# Patient Record
Sex: Male | Born: 1980 | Race: Black or African American | Hispanic: No | Marital: Single | Smoking: Current some day smoker
Health system: Southern US, Community
[De-identification: ages and names within clinical notes are randomized; demographics above are authoritative.]

## PROBLEM LIST (undated history)

## (undated) DIAGNOSIS — M79606 Pain in leg, unspecified: Secondary | ICD-10-CM

## (undated) DIAGNOSIS — R030 Elevated blood-pressure reading, without diagnosis of hypertension: Secondary | ICD-10-CM

## (undated) DIAGNOSIS — R739 Hyperglycemia, unspecified: Secondary | ICD-10-CM

## (undated) DIAGNOSIS — R002 Palpitations: Secondary | ICD-10-CM

## (undated) HISTORY — PX: FEMUR FRACTURE SURGERY: SHX633

## (undated) HISTORY — PX: LEG SURGERY: SHX1003

## (undated) HISTORY — DX: Palpitations: R00.2

## (undated) HISTORY — DX: Hyperglycemia, unspecified: R73.9

## (undated) HISTORY — DX: Elevated blood-pressure reading, without diagnosis of hypertension: R03.0

## (undated) HISTORY — DX: Pain in leg, unspecified: M79.606

---

## 2003-09-03 ENCOUNTER — Emergency Department (HOSPITAL_COMMUNITY): Admission: EM | Admit: 2003-09-03 | Discharge: 2003-09-04 | Payer: Self-pay | Admitting: Emergency Medicine

## 2003-09-04 ENCOUNTER — Encounter: Payer: Self-pay | Admitting: Emergency Medicine

## 2004-01-25 ENCOUNTER — Emergency Department (HOSPITAL_COMMUNITY): Admission: EM | Admit: 2004-01-25 | Discharge: 2004-01-25 | Payer: Self-pay | Admitting: Emergency Medicine

## 2004-08-05 ENCOUNTER — Emergency Department (HOSPITAL_COMMUNITY): Admission: EM | Admit: 2004-08-05 | Discharge: 2004-08-05 | Payer: Self-pay | Admitting: Emergency Medicine

## 2004-12-19 ENCOUNTER — Emergency Department (HOSPITAL_COMMUNITY): Admission: EM | Admit: 2004-12-19 | Discharge: 2004-12-19 | Payer: Self-pay | Admitting: Emergency Medicine

## 2005-01-20 ENCOUNTER — Emergency Department (HOSPITAL_COMMUNITY): Admission: EM | Admit: 2005-01-20 | Discharge: 2005-01-20 | Payer: Self-pay | Admitting: Family Medicine

## 2007-01-20 ENCOUNTER — Emergency Department (HOSPITAL_COMMUNITY): Admission: EM | Admit: 2007-01-20 | Discharge: 2007-01-21 | Payer: Self-pay | Admitting: Emergency Medicine

## 2008-05-06 ENCOUNTER — Emergency Department (HOSPITAL_COMMUNITY): Admission: EM | Admit: 2008-05-06 | Discharge: 2008-05-06 | Payer: Self-pay | Admitting: Emergency Medicine

## 2008-08-22 ENCOUNTER — Emergency Department (HOSPITAL_COMMUNITY): Admission: EM | Admit: 2008-08-22 | Discharge: 2008-08-22 | Payer: Self-pay | Admitting: Family Medicine

## 2008-10-26 ENCOUNTER — Inpatient Hospital Stay (HOSPITAL_COMMUNITY): Admission: EM | Admit: 2008-10-26 | Discharge: 2008-10-29 | Payer: Self-pay | Admitting: Emergency Medicine

## 2008-11-16 ENCOUNTER — Encounter: Admission: RE | Admit: 2008-11-16 | Discharge: 2008-12-22 | Payer: Self-pay | Admitting: Orthopedic Surgery

## 2008-12-29 ENCOUNTER — Encounter: Admission: RE | Admit: 2008-12-29 | Discharge: 2009-03-29 | Payer: Self-pay | Admitting: Orthopedic Surgery

## 2009-04-14 ENCOUNTER — Ambulatory Visit (HOSPITAL_COMMUNITY): Admission: RE | Admit: 2009-04-14 | Discharge: 2009-04-14 | Payer: Self-pay | Admitting: Orthopedic Surgery

## 2009-09-08 ENCOUNTER — Ambulatory Visit (HOSPITAL_COMMUNITY): Admission: RE | Admit: 2009-09-08 | Discharge: 2009-09-08 | Payer: Self-pay | Admitting: Orthopedic Surgery

## 2010-03-27 ENCOUNTER — Emergency Department (HOSPITAL_COMMUNITY): Admission: EM | Admit: 2010-03-27 | Discharge: 2010-03-27 | Payer: Self-pay | Admitting: Family Medicine

## 2010-09-17 ENCOUNTER — Emergency Department (HOSPITAL_COMMUNITY): Admission: EM | Admit: 2010-09-17 | Discharge: 2010-09-17 | Payer: Self-pay | Admitting: Family Medicine

## 2010-11-20 ENCOUNTER — Emergency Department (HOSPITAL_COMMUNITY): Admission: EM | Admit: 2010-11-20 | Discharge: 2010-11-21 | Payer: Self-pay | Admitting: Emergency Medicine

## 2011-01-14 ENCOUNTER — Emergency Department (HOSPITAL_COMMUNITY)
Admission: EM | Admit: 2011-01-14 | Discharge: 2011-01-14 | Payer: Self-pay | Source: Home / Self Care | Admitting: Emergency Medicine

## 2011-01-15 LAB — RAPID STREP SCREEN (MED CTR MEBANE ONLY): Streptococcus, Group A Screen (Direct): NEGATIVE

## 2011-03-29 LAB — CBC
HCT: 46 % (ref 39.0–52.0)
MCHC: 33.8 g/dL (ref 30.0–36.0)
MCV: 90.3 fL (ref 78.0–100.0)
RBC: 5.09 MIL/uL (ref 4.22–5.81)
WBC: 4.9 10*3/uL (ref 4.0–10.5)

## 2011-03-29 LAB — DIFFERENTIAL
Basophils Relative: 1 % (ref 0–1)
Eosinophils Absolute: 0.1 10*3/uL (ref 0.0–0.7)
Eosinophils Relative: 3 % (ref 0–5)
Lymphs Abs: 1.8 10*3/uL (ref 0.7–4.0)
Monocytes Absolute: 0.3 10*3/uL (ref 0.1–1.0)
Monocytes Relative: 6 % (ref 3–12)
Neutrophils Relative %: 53 % (ref 43–77)

## 2011-03-29 LAB — BASIC METABOLIC PANEL
CO2: 28 mEq/L (ref 19–32)
Chloride: 102 mEq/L (ref 96–112)
Creatinine, Ser: 1.35 mg/dL (ref 0.4–1.5)
GFR calc Af Amer: >60 mL/min (ref >60)
Potassium: 4.3 mEq/L (ref 3.5–5.1)

## 2011-03-29 LAB — URINALYSIS, ROUTINE W REFLEX MICROSCOPIC
Bilirubin Urine: NEGATIVE
Glucose, UA: NEGATIVE mg/dL
Hgb urine dipstick: NEGATIVE
Specific Gravity, Urine: 1.027 (ref 1.005–1.030)
Urobilinogen, UA: 1 mg/dL (ref 0.0–1.0)
pH: 6.5 (ref 5.0–8.0)

## 2011-03-29 LAB — PROTIME-INR: INR: 0.9 (ref 0.00–1.49)

## 2011-04-03 LAB — BASIC METABOLIC PANEL
CO2: 28 mEq/L (ref 19–32)
Calcium: 9.6 mg/dL (ref 8.4–10.5)
Chloride: 101 mEq/L (ref 96–112)
GFR calc Af Amer: >60 mL/min (ref >60)
Glucose, Bld: 106 mg/dL — ABNORMAL HIGH (ref 70–99)
Potassium: 4.3 mEq/L (ref 3.5–5.1)
Sodium: 136 mEq/L (ref 135–145)

## 2011-04-03 LAB — URINALYSIS, ROUTINE W REFLEX MICROSCOPIC
Bilirubin Urine: NEGATIVE
Hgb urine dipstick: NEGATIVE
Specific Gravity, Urine: 1.01 (ref 1.005–1.030)
Urobilinogen, UA: 1 mg/dL (ref 0.0–1.0)
pH: 7 (ref 5.0–8.0)

## 2011-04-03 LAB — DIFFERENTIAL
Basophils Relative: 1 % (ref 0–1)
Eosinophils Absolute: 0.1 10*3/uL (ref 0.0–0.7)
Monocytes Relative: 7 % (ref 3–12)
Neutrophils Relative %: 55 % (ref 43–77)

## 2011-04-03 LAB — CBC
MCHC: 33.9 g/dL (ref 30.0–36.0)
MCV: 88.2 fL (ref 78.0–100.0)
RBC: 5.19 MIL/uL (ref 4.22–5.81)
RDW: 16.1 % — ABNORMAL HIGH (ref 11.5–15.5)

## 2011-04-03 LAB — PROTIME-INR: INR: 0.9 (ref 0.00–1.49)

## 2011-04-30 ENCOUNTER — Emergency Department (HOSPITAL_COMMUNITY)
Admission: EM | Admit: 2011-04-30 | Discharge: 2011-04-30 | Disposition: A | Discharge status: Home / Self Care | Payer: Self-pay | Attending: Emergency Medicine | Admitting: Emergency Medicine

## 2011-04-30 DIAGNOSIS — J020 Streptococcal pharyngitis: Secondary | ICD-10-CM | POA: Insufficient documentation

## 2011-04-30 DIAGNOSIS — R6883 Chills (without fever): Secondary | ICD-10-CM | POA: Insufficient documentation

## 2011-04-30 DIAGNOSIS — R070 Pain in throat: Secondary | ICD-10-CM | POA: Insufficient documentation

## 2011-04-30 DIAGNOSIS — R599 Enlarged lymph nodes, unspecified: Secondary | ICD-10-CM | POA: Insufficient documentation
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
## 2011-09-24 LAB — URINALYSIS, ROUTINE W REFLEX MICROSCOPIC
Bilirubin Urine: NEGATIVE
Hgb urine dipstick: NEGATIVE
Specific Gravity, Urine: 1.022
pH: 6

## 2011-09-24 LAB — COMPREHENSIVE METABOLIC PANEL
ALT: 21
AST: 44 — ABNORMAL HIGH
Alkaline Phosphatase: 56
CO2: 27
Glucose, Bld: 98
Potassium: 3.7
Sodium: 133 — ABNORMAL LOW
Total Protein: 6.4

## 2011-09-24 LAB — CBC
HCT: 45.9
MCV: 90.6
RBC: 5.07
WBC: 9.4

## 2011-09-24 LAB — BASIC METABOLIC PANEL
BUN: 4 — ABNORMAL LOW
CO2: 32
Chloride: 97
GFR calc non Af Amer: >60
Glucose, Bld: 105 — ABNORMAL HIGH
Potassium: 3.8

## 2011-09-24 LAB — RAPID URINE DRUG SCREEN, HOSP PERFORMED
Amphetamines: NOT DETECTED
Barbiturates: NOT DETECTED

## 2011-09-24 LAB — HEMOGLOBIN AND HEMATOCRIT, BLOOD: Hemoglobin: 12.7 — ABNORMAL LOW

## 2011-09-24 LAB — URINE MICROSCOPIC-ADD ON

## 2011-09-24 LAB — POCT I-STAT, CHEM 8
Chloride: 102
Creatinine, Ser: 2 — ABNORMAL HIGH
Glucose, Bld: 112 — ABNORMAL HIGH
Potassium: 3.6

## 2012-03-10 ENCOUNTER — Encounter (HOSPITAL_COMMUNITY): Payer: Self-pay | Admitting: *Deleted

## 2012-03-10 ENCOUNTER — Emergency Department (INDEPENDENT_AMBULATORY_CARE_PROVIDER_SITE_OTHER)
Admission: EM | Admit: 2012-03-10 | Discharge: 2012-03-10 | Disposition: A | Discharge status: Home / Self Care | Payer: Self-pay | Source: Home / Self Care | Attending: Family Medicine | Admitting: Family Medicine

## 2012-03-10 DIAGNOSIS — M25569 Pain in unspecified knee: Secondary | ICD-10-CM

## 2012-03-10 DIAGNOSIS — M25562 Pain in left knee: Secondary | ICD-10-CM

## 2012-03-10 MED ORDER — DICLOFENAC POTASSIUM 50 MG PO TABS: 50.0000 mg | ORAL_TABLET | Freq: Three times a day (TID) | ORAL | Status: AC

## 2012-03-10 NOTE — Discharge Instructions (Signed)
Wear knee brace for comfort and see your orthopedist if further problems.
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
## 2016-07-15 ENCOUNTER — Emergency Department (HOSPITAL_COMMUNITY): Payer: Self-pay

## 2016-07-15 ENCOUNTER — Emergency Department (HOSPITAL_COMMUNITY)
Admission: EM | Admit: 2016-07-15 | Discharge: 2016-07-15 | Disposition: A | Discharge status: Home / Self Care | Payer: Self-pay | Attending: Emergency Medicine | Admitting: Emergency Medicine

## 2016-07-15 ENCOUNTER — Encounter (HOSPITAL_COMMUNITY): Payer: Self-pay | Admitting: *Deleted

## 2016-07-15 DIAGNOSIS — G8929 Other chronic pain: Secondary | ICD-10-CM

## 2016-07-15 DIAGNOSIS — M79605 Pain in left leg: Secondary | ICD-10-CM | POA: Insufficient documentation

## 2016-07-15 DIAGNOSIS — M25462 Effusion, left knee: Secondary | ICD-10-CM | POA: Insufficient documentation

## 2016-07-15 DIAGNOSIS — M25552 Pain in left hip: Secondary | ICD-10-CM | POA: Insufficient documentation

## 2016-07-15 DIAGNOSIS — M25562 Pain in left knee: Secondary | ICD-10-CM | POA: Insufficient documentation

## 2016-07-15 DIAGNOSIS — F17210 Nicotine dependence, cigarettes, uncomplicated: Secondary | ICD-10-CM | POA: Insufficient documentation

## 2016-07-15 DIAGNOSIS — M79602 Pain in left arm: Secondary | ICD-10-CM

## 2016-07-15 MED ORDER — NAPROXEN 500 MG PO TABS: 500.0000 mg | ORAL_TABLET | Freq: Two times a day (BID) | ORAL | 0 refills | Status: DC

## 2016-07-15 NOTE — Discharge Instructions (Signed)
Medications: Naprosyn  Treatment: Take Naprosyn twice daily for your pain and inflammation. Use ice 3-4 times daily alternating 20 minutes on, 20 minutes off. Wear your knee brace for support at all times except when bathing.  Follow-up: Please follow-up with Dr. Nilsa Nutting office, an orthopedic doctor, for further evaluation and treatment of your leg pain. Please return to the emergency department if you develop any new or worsening symptoms.
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
## 2016-12-12 ENCOUNTER — Ambulatory Visit (HOSPITAL_COMMUNITY)
Admission: EM | Admit: 2016-12-12 | Discharge: 2016-12-12 | Disposition: A | Discharge status: Home / Self Care | Payer: BLUE CROSS/BLUE SHIELD | Attending: Emergency Medicine | Admitting: Emergency Medicine

## 2016-12-12 ENCOUNTER — Encounter (HOSPITAL_COMMUNITY): Payer: Self-pay | Admitting: Emergency Medicine

## 2016-12-12 DIAGNOSIS — M5442 Lumbago with sciatica, left side: Secondary | ICD-10-CM | POA: Diagnosis not present

## 2016-12-12 DIAGNOSIS — G8929 Other chronic pain: Secondary | ICD-10-CM

## 2016-12-12 DIAGNOSIS — M62830 Muscle spasm of back: Secondary | ICD-10-CM

## 2016-12-12 MED ORDER — KETOROLAC TROMETHAMINE 60 MG/2ML IM SOLN
60.0000 mg | Freq: Once | INTRAMUSCULAR | Status: AC
  Administered 2016-12-12: 60 mg via INTRAMUSCULAR

## 2016-12-12 MED ORDER — DICLOFENAC SODIUM 50 MG PO TBEC: 50.0000 mg | DELAYED_RELEASE_TABLET | Freq: Three times a day (TID) | ORAL | 0 refills | Status: DC

## 2016-12-12 MED ORDER — CYCLOBENZAPRINE HCL 10 MG PO TABS: 10.0000 mg | ORAL_TABLET | Freq: Three times a day (TID) | ORAL | 0 refills | Status: DC | PRN

## 2016-12-12 MED ORDER — KETOROLAC TROMETHAMINE 60 MG/2ML IM SOLN
INTRAMUSCULAR | Status: AC
  Filled 2016-12-12: qty 2
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
## 2016-12-12 NOTE — ED Provider Notes (Signed)
CSN: [id number]     Arrival date & time 12/12/16  1216 History   First MD Initiated Contact with Patient 12/12/16 1250     Chief Complaint  Patient presents with  . Back Pain   (Consider location/radiation/quality/duration/timing/severity/associated sxs/prior Treatment) HPI: 35 yo male with H/O lower back pain with hardware in left leg, presented with CC of lower back pain radiating to left leg x3 days. Pain gradually worsening.Pain worse with active/passive movement, prolong standing. Denies recent injury/trauma/fall  History reviewed. No pertinent past medical history. Past Surgical History:  Procedure Laterality Date  . FEMUR FRACTURE SURGERY    . LEG SURGERY     Family History  Problem Relation Age of Onset  . Hypertension Mother   . Asthma Other    Social History  Substance Use Topics  . Smoking status: Current Every Day Smoker    Packs/day: 0.50    Years: 10.00    Types: Cigarettes  . Smokeless tobacco: Never Used  . Alcohol use No    Review of Systems  Musculoskeletal: Positive for back pain.  Back pain , radiating to left leg.  Allergies  Patient has no known allergies.  Home Medications   Prior to Admission medications   Medication Sig Start Date End Date Taking? Authorizing Provider  cyclobenzaprine (FLEXERIL) 10 MG tablet Take 1 tablet (10 mg total) by mouth 3 (three) times daily as needed for muscle spasms. 12/12/16   Lorrin Nawrot, NP  diclofenac (VOLTAREN) 50 MG EC tablet Take 1 tablet (50 mg total) by mouth 3 (three) times daily. 12/12/16   Matilda Fleig, NP  naproxen (NAPROSYN) 500 MG tablet Take 1 tablet (500 mg total) by mouth 2 (two) times daily. 07/15/16   Emi HolesAlexandra M Law, PA-C   Meds Ordered and Administered this Visit   Medications  ketorolac (TORADOL) injection 60 mg (not administered)    BP 117/72 (BP Location: Left Arm)   Pulse 86   Temp 99.4 F (37.4 C) (Oral)   Resp 16   SpO2 96%  No data found.   Physical Exam   Musculoskeletal: He exhibits tenderness. He exhibits no edema or deformity.  Pain to lower back, tenderness to palpation on L/S vertebrae and papa spinuous processes. Loss of lumbar lordosis due to muscle spasm. Unable to perform straight leg raise test due to pain. No visible swelling/bruising appreciated  Urgent Care Course   Clinical Course     Procedures (including critical care time)  Labs Review Labs Reviewed - No data to display  Imaging Review No results found.   Visual Acuity Review  Right Eye Distance:   Left Eye Distance:   Bilateral Distance:    Right Eye Near:   Left Eye Near:    Bilateral Near:         MDM   1. Chronic bilateral low back pain with left-sided sciatica   2. Muscle spasm of back    Bending stretching exercises as tolerated  Heating pad to lower back    Llewellyn Choplin, NP 12/12/16 1311

## 2016-12-12 NOTE — Discharge Instructions (Signed)
Light duty (Avoid bending/stretching/pulling/pushing) x 2 days

## 2017-04-15 ENCOUNTER — Emergency Department (HOSPITAL_COMMUNITY): Payer: BLUE CROSS/BLUE SHIELD

## 2017-04-15 ENCOUNTER — Emergency Department (HOSPITAL_COMMUNITY)
Admission: EM | Admit: 2017-04-15 | Discharge: 2017-04-15 | Disposition: A | Discharge status: Home / Self Care | Payer: BLUE CROSS/BLUE SHIELD | Attending: Emergency Medicine | Admitting: Emergency Medicine

## 2017-04-15 DIAGNOSIS — R079 Chest pain, unspecified: Secondary | ICD-10-CM | POA: Insufficient documentation

## 2017-04-15 DIAGNOSIS — G8929 Other chronic pain: Secondary | ICD-10-CM | POA: Insufficient documentation

## 2017-04-15 DIAGNOSIS — R0602 Shortness of breath: Secondary | ICD-10-CM | POA: Insufficient documentation

## 2017-04-15 DIAGNOSIS — F17210 Nicotine dependence, cigarettes, uncomplicated: Secondary | ICD-10-CM | POA: Insufficient documentation

## 2017-04-15 DIAGNOSIS — M79605 Pain in left leg: Secondary | ICD-10-CM | POA: Diagnosis not present

## 2017-04-15 DIAGNOSIS — Z79899 Other long term (current) drug therapy: Secondary | ICD-10-CM | POA: Diagnosis not present

## 2017-04-15 LAB — CBC WITH DIFFERENTIAL/PLATELET
BASOS ABS: 0.1 10*3/uL (ref 0.0–0.1)
Basophils Relative: 1 %
EOS ABS: 0.4 10*3/uL (ref 0.0–0.7)
EOS PCT: 6 %
HCT: 43.3 % (ref 39.0–52.0)
Hemoglobin: 14.7 g/dL (ref 13.0–17.0)
LYMPHS PCT: 39 %
Lymphs Abs: 2.7 10*3/uL (ref 0.7–4.0)
MCH: 29.3 pg (ref 26.0–34.0)
MCHC: 33.9 g/dL (ref 30.0–36.0)
MCV: 86.3 fL (ref 78.0–100.0)
MONO ABS: 0.7 10*3/uL (ref 0.1–1.0)
Monocytes Relative: 10 %
Neutro Abs: 3 10*3/uL (ref 1.7–7.7)
Neutrophils Relative %: 44 %
PLATELETS: 387 10*3/uL (ref 150–400)
RBC: 5.02 MIL/uL (ref 4.22–5.81)
RDW: 14.6 % (ref 11.5–15.5)
WBC: 6.9 10*3/uL (ref 4.0–10.5)

## 2017-04-15 LAB — COMPREHENSIVE METABOLIC PANEL
ALT: 18 U/L (ref 17–63)
AST: 23 U/L (ref 15–41)
Albumin: 3.8 g/dL (ref 3.5–5.0)
Alkaline Phosphatase: 92 U/L (ref 38–126)
Anion gap: 8 (ref 5–15)
BUN: 14 mg/dL (ref 6–20)
CALCIUM: 9 mg/dL (ref 8.9–10.3)
CHLORIDE: 102 mmol/L (ref 101–111)
CO2: 27 mmol/L (ref 22–32)
CREATININE: 1.12 mg/dL (ref 0.61–1.24)
Glucose, Bld: 128 mg/dL — ABNORMAL HIGH (ref 65–99)
Potassium: 4 mmol/L (ref 3.5–5.1)
SODIUM: 137 mmol/L (ref 135–145)
Total Bilirubin: 0.5 mg/dL (ref 0.3–1.2)
Total Protein: 8 g/dL (ref 6.5–8.1)

## 2017-04-15 LAB — I-STAT TROPONIN, ED: TROPONIN I, POC: 0 ng/mL (ref 0.00–0.08)

## 2017-04-15 LAB — D-DIMER, QUANTITATIVE: D-Dimer, Quant: 0.31 ug/mL-FEU (ref 0.00–0.50)

## 2017-04-15 MED ORDER — TRAMADOL HCL 50 MG PO TABS: 50.0000 mg | ORAL_TABLET | Freq: Two times a day (BID) | ORAL | 0 refills | Status: DC | PRN

## 2017-04-15 MED ORDER — KETOROLAC TROMETHAMINE 60 MG/2ML IM SOLN
60.0000 mg | Freq: Once | INTRAMUSCULAR | Status: AC
  Administered 2017-04-15: 60 mg via INTRAMUSCULAR
  Filled 2017-04-15: qty 2

## 2017-04-15 MED ORDER — TRAMADOL HCL 50 MG PO TABS
50.0000 mg | ORAL_TABLET | Freq: Once | ORAL | Status: AC
  Administered 2017-04-15: 50 mg via ORAL
  Filled 2017-04-15: qty 1

## 2017-04-15 NOTE — ED Notes (Signed)
Bed: WA04 Expected date:  Expected time:  Means of arrival:  Comments: 
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
## 2017-04-15 NOTE — ED Triage Notes (Signed)
Pt c/o left leg pain for 2-3 days. Pt can barely put weight on it. Pain is on side of leg between hip and knee. Pt also stating he feels like his heart is stopping and has chills. Denies chest pain, states he does have some shortness of breath.

## 2017-04-15 NOTE — ED Notes (Signed)
Bed: WA03 Expected date:  Expected time:  Means of arrival:  Comments: 

## 2017-04-24 ENCOUNTER — Ambulatory Visit: Payer: BLUE CROSS/BLUE SHIELD | Attending: Internal Medicine | Admitting: Physician Assistant

## 2017-04-24 VITALS — BP 147/93 | HR 79 | Temp 98.2°F | Resp 16 | Wt 256.8 lb

## 2017-04-24 DIAGNOSIS — R030 Elevated blood-pressure reading, without diagnosis of hypertension: Secondary | ICD-10-CM | POA: Diagnosis not present

## 2017-04-24 DIAGNOSIS — R739 Hyperglycemia, unspecified: Secondary | ICD-10-CM | POA: Diagnosis not present

## 2017-04-24 DIAGNOSIS — R002 Palpitations: Secondary | ICD-10-CM

## 2017-04-24 DIAGNOSIS — M79605 Pain in left leg: Secondary | ICD-10-CM | POA: Insufficient documentation

## 2017-04-24 MED ORDER — NAPROXEN 500 MG PO TABS: 500.0000 mg | ORAL_TABLET | Freq: Two times a day (BID) | ORAL | 1 refills | Status: DC

## 2017-04-24 MED ORDER — METHOCARBAMOL 500 MG PO TABS: 500.0000 mg | ORAL_TABLET | Freq: Three times a day (TID) | ORAL | 0 refills | Status: DC
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
## 2017-04-24 NOTE — Patient Instructions (Addendum)
Check your blood pressure 3 times weekly and record and bring to next visit   Hyperglycemia Hyperglycemia is when the sugar (glucose) level in your blood is too high. It may not cause symptoms. If you do have symptoms, they may include warning signs, such as:  Feeling more thirsty than normal.  Hunger.  Feeling tired.  Needing to pee (urinate) more than normal.  Blurry eyesight (vision). You may get other symptoms as it gets worse, such as:  Dry mouth.  Not being hungry (loss of appetite).  Fruity-smelling breath.  Weakness.  Weight gain or loss that is not planned. Weight loss may be fast.  A tingling or numb feeling in your hands or feet.  Headache.  Skin that does not bounce back quickly when it is lightly pinched and released (poor skin turgor).  Pain in your belly (abdomen).  Cuts or bruises that heal slowly. High blood sugar can happen to people who do or do not have diabetes. High blood sugar can happen slowly or quickly, and it can be an emergency. Follow these instructions at home: General instructions   Take over-the-counter and prescription medicines only as told by your doctor.  Do not use products that contain nicotine or tobacco, such as cigarettes and e-cigarettes. If you need help quitting, ask your doctor.  Limit alcohol intake to no more than 1 drink per day for nonpregnant women and 2 drinks per day for men. One drink equals 12 oz of beer, 5 oz of wine, or 1 oz of hard liquor.  Manage stress. If you need help with this, ask your doctor.  Keep all follow-up visits as told by your doctor. This is important. Eating and drinking   Stay at a healthy weight.  Exercise regularly, as told by your doctor.  Drink enough fluid, especially when you:  Exercise.  Get sick.  Are in hot temperatures.  Eat healthy foods, such as:  Low-fat (lean) proteins.  Complex carbs (complex carbohydrates), such as whole wheat bread or brown rice.  Fresh  fruits and vegetables.  Low-fat dairy products.  Healthy fats.  Drink enough fluid to keep your pee (urine) clear or pale yellow. If you have diabetes:   Make sure you know the symptoms of hyperglycemia.  Follow your diabetes management plan, as told by your doctor. Make sure you:  Take insulin and medicines as told.  Follow your exercise plan.  Follow your meal plan. Eat on time. Do not skip meals.  Check your blood sugar as often as told. Make sure to check before and after exercise. If you exercise longer or in a different way than you normally do, check your blood sugar more often.  Follow your sick day plan whenever you cannot eat or drink normally. Make this plan ahead of time with your doctor.  Share your diabetes management plan with people in your workplace, school, and household.  Check your urine for ketones when you are ill and as told by your doctor.  Carry a card or wear jewelry that says that you have diabetes. Contact a doctor if:  Your blood sugar level is higher than 240 mg/dL (30.1 mmol/L) for 2 days in a row.  You have problems keeping your blood sugar in your target range.  High blood sugar happens often for you. Get help right away if:  You have trouble breathing.  You have a change in how you think, feel, or act (mental status).  You feel sick to your stomach (nauseous),  and that feeling does not go away.  You cannot stop throwing up (vomiting). These symptoms may be an emergency. Do not wait to see if the symptoms will go away. Get medical help right away. Call your local emergency services (911 in the [place] Do not drive yourself to the hospital. Summary  Hyperglycemia is when the sugar (glucose) level in your blood is too high.  High blood sugar can happen to people who do or do not have diabetes.  Make sure you drink enough fluids, eat healthy foods, and exercise regularly.  Contact your doctor if you have problems keeping your blood  sugar in your target range. This information is not intended to replace advice given to you by your health care provider. Make sure you discuss any questions you have with your health care provider. Document Released: 10/06/2009 Document Revised: 08/26/2016 Document Reviewed: 08/26/2016 Elsevier Interactive Patient Education  2017 [employer]  Hypertension Hypertension, commonly called high blood pressure, is when the force of blood pumping through the arteries is too strong. The arteries are the blood vessels that carry blood from the heart throughout the body. Hypertension forces the heart to work harder to pump blood and may cause arteries to become narrow or stiff. Having untreated or uncontrolled hypertension can cause heart attacks, strokes, kidney disease, and other problems. A blood pressure reading consists of a higher number over a lower number. Ideally, your blood pressure should be below 120/80. The first ("top") number is called the systolic pressure. It is a measure of the pressure in your arteries as your heart beats. The second ("bottom") number is called the diastolic pressure. It is a measure of the pressure in your arteries as the heart relaxes. What are the causes? The cause of this condition is not known. What increases the risk? Some risk factors for high blood pressure are under your control. Others are not. Factors you can change   Smoking.  Having type 2 diabetes mellitus, high cholesterol, or both.  Not getting enough exercise or physical activity.  Being overweight.  Having too much fat, sugar, calories, or salt (sodium) in your diet.  Drinking too much alcohol. Factors that are difficult or impossible to change   Having chronic kidney disease.  Having a family history of high blood pressure.  Age. Risk increases with age.  Race. You may be at higher risk if you are African-American.  Gender. Men are at higher risk than women before age 92. After age  66, women are at higher risk than men.  Having obstructive sleep apnea.  Stress. What are the signs or symptoms? Extremely high blood pressure (hypertensive crisis) may cause:  Headache.  Anxiety.  Shortness of breath.  Nosebleed.  Nausea and vomiting.  Severe chest pain.  Jerky movements you cannot control (seizures). How is this diagnosed? This condition is diagnosed by measuring your blood pressure while you are seated, with your arm resting on a surface. The cuff of the blood pressure monitor will be placed directly against the skin of your upper arm at the level of your heart. It should be measured at least twice using the same arm. Certain conditions can cause a difference in blood pressure between your right and left arms. Certain factors can cause blood pressure readings to be lower or higher than normal (elevated) for a short period of time:  When your blood pressure is higher when you are in a health care provider's office than when you are at home, this is  called white coat hypertension. Most people with this condition do not need medicines.  When your blood pressure is higher at home than when you are in a health care provider's office, this is called masked hypertension. Most people with this condition may need medicines to control blood pressure. If you have a high blood pressure reading during one visit or you have normal blood pressure with other risk factors:  You may be asked to return on a different day to have your blood pressure checked again.  You may be asked to monitor your blood pressure at home for 1 week or longer. If you are diagnosed with hypertension, you may have other blood or imaging tests to help your health care provider understand your overall risk for other conditions. How is this treated? This condition is treated by making healthy lifestyle changes, such as eating healthy foods, exercising more, and reducing your alcohol intake. Your health  care provider may prescribe medicine if lifestyle changes are not enough to get your blood pressure under control, and if:  Your systolic blood pressure is above 130.  Your diastolic blood pressure is above 80. Your personal target blood pressure may vary depending on your medical conditions, your age, and other factors. Follow these instructions at home: Eating and drinking   Eat a diet that is high in fiber and potassium, and low in sodium, added sugar, and fat. An example eating plan is called the DASH (Dietary Approaches to Stop Hypertension) diet. To eat this way:  Eat plenty of fresh fruits and vegetables. Try to fill half of your plate at each meal with fruits and vegetables.  Eat whole grains, such as whole wheat pasta, brown rice, or whole grain bread. Fill about one quarter of your plate with whole grains.  Eat or drink low-fat dairy products, such as skim milk or low-fat yogurt.  Avoid fatty cuts of meat, processed or cured meats, and poultry with skin. Fill about one quarter of your plate with lean proteins, such as fish, chicken without skin, beans, eggs, and tofu.  Avoid premade and processed foods. These tend to be higher in sodium, added sugar, and fat.  Reduce your daily sodium intake. Most people with hypertension should eat less than 1,500 mg of sodium a day.  Limit alcohol intake to no more than 1 drink a day for nonpregnant women and 2 drinks a day for men. One drink equals 12 oz of beer, 5 oz of wine, or 1 oz of hard liquor. Lifestyle   Work with your health care provider to maintain a healthy body weight or to lose weight. Ask what an ideal weight is for you.  Get at least 30 minutes of exercise that causes your heart to beat faster (aerobic exercise) most days of the week. Activities may include walking, swimming, or biking.  Include exercise to strengthen your muscles (resistance exercise), such as pilates or lifting weights, as part of your weekly exercise  routine. Try to do these types of exercises for 30 minutes at least 3 days a week.  Do not use any products that contain nicotine or tobacco, such as cigarettes and e-cigarettes. If you need help quitting, ask your health care provider.  Monitor your blood pressure at home as told by your health care provider.  Keep all follow-up visits as told by your health care provider. This is important. Medicines   Take over-the-counter and prescription medicines only as told by your health care provider. Follow directions carefully. Blood  pressure medicines must be taken as prescribed.  Do not skip doses of blood pressure medicine. Doing this puts you at risk for problems and can make the medicine less effective.  Ask your health care provider about side effects or reactions to medicines that you should watch for. Contact a health care provider if:  You think you are having a reaction to a medicine you are taking.  You have headaches that keep coming back (recurring).  You feel dizzy.  You have swelling in your ankles.  You have trouble with your vision. Get help right away if:  You develop a severe headache or confusion.  You have unusual weakness or numbness.  You feel faint.  You have severe pain in your chest or abdomen.  You vomit repeatedly.  You have trouble breathing. Summary  Hypertension is when the force of blood pumping through your arteries is too strong. If this condition is not controlled, it may put you at risk for serious complications.  Your personal target blood pressure may vary depending on your medical conditions, your age, and other factors. For most people, a normal blood pressure is less than 120/80.  Hypertension is treated with lifestyle changes, medicines, or a combination of both. Lifestyle changes include weight loss, eating a healthy, low-sodium diet, exercising more, and limiting alcohol. This information is not intended to replace advice given to  you by your health care provider. Make sure you discuss any questions you have with your health care provider. Document Released: 12/09/2005 Document Revised: 11/06/2016 Document Reviewed: 11/06/2016 Elsevier Interactive Patient Education  2017 [employer].

## 2017-04-25 ENCOUNTER — Other Ambulatory Visit: Payer: Self-pay | Admitting: Physician Assistant

## 2017-04-25 ENCOUNTER — Telehealth: Payer: Self-pay

## 2017-04-25 DIAGNOSIS — E559 Vitamin D deficiency, unspecified: Secondary | ICD-10-CM

## 2017-04-25 LAB — TSH: TSH: 1.05 u[IU]/mL (ref 0.450–4.500)

## 2017-04-25 LAB — VITAMIN D 25 HYDROXY (VIT D DEFICIENCY, FRACTURES): Vit D, 25-Hydroxy: 8 ng/mL — ABNORMAL LOW (ref 30.0–100.0)

## 2017-04-25 MED ORDER — VITAMIN D (ERGOCALCIFEROL) 1.25 MG (50000 UNIT) PO CAPS: 50000.0000 [IU] | ORAL_CAPSULE | ORAL | 0 refills | Status: DC

## 2017-04-25 NOTE — Telephone Encounter (Signed)
Contacted pt to go over lab results pt didn't answer lvm asking pt to give me a call at his earliest convenience  

## 2017-05-01 DIAGNOSIS — R002 Palpitations: Secondary | ICD-10-CM | POA: Insufficient documentation

## 2017-05-01 DIAGNOSIS — M79606 Pain in leg, unspecified: Secondary | ICD-10-CM

## 2017-05-01 DIAGNOSIS — R739 Hyperglycemia, unspecified: Secondary | ICD-10-CM

## 2017-05-01 DIAGNOSIS — R030 Elevated blood-pressure reading, without diagnosis of hypertension: Secondary | ICD-10-CM | POA: Insufficient documentation

## 2017-05-01 HISTORY — DX: Hyperglycemia, unspecified: R73.9

## 2017-05-01 HISTORY — DX: Pain in leg, unspecified: M79.606

## 2017-05-01 HISTORY — DX: Palpitations: R00.2

## 2017-05-01 HISTORY — DX: Elevated blood-pressure reading, without diagnosis of hypertension: R03.0

## 2017-05-26 ENCOUNTER — Ambulatory Visit: Payer: BLUE CROSS/BLUE SHIELD | Admitting: Cardiology

## 2017-05-26 NOTE — Progress Notes (Deleted)
  Cardiology Office Note:    Date:  05/26/2017   ID:  Alexander ShownSentellis D Lapenna, DOB 01-Apr-1981, MRN [id number]  PCP:  Patient, No Pcp Per  Cardiologist:  Donato SchultzMark Kimberlyn Quiocho, MD    Referring MD: Anders SimmondsMcClung, Angela M, PA-C     History of Present Illness:    Alexander Mccarthy is a 36 y.o. male here for the evaluation of palpitations at the request of Denton Brickngela McCuing, PA  No past medical history on file.  Past Surgical History:  Procedure Laterality Date  . FEMUR FRACTURE SURGERY    . LEG SURGERY      Current Medications: No outpatient prescriptions have been marked as taking for the 05/26/17 encounter (Appointment) with Jake BatheSkains, Geoffery Aultman C, MD.     Allergies:   Patient has no known allergies.   Social History   Social History  . Marital status: Single    Spouse name: N/A  . Number of children: N/A  . Years of education: N/A   Social History Main Topics  . Smoking status: Current Every Day Smoker    Packs/day: 0.50    Years: 10.00    Types: Cigarettes  . Smokeless tobacco: Never Used  . Alcohol use No  . Drug use: No  . Sexual activity: Not on file   Other Topics Concern  . Not on file   Social History Narrative  . No narrative on file     Family History: The patient's ***family history includes Asthma in his other; Hypertension in his mother. ROS:   Please see the history of present illness.    *** All other systems reviewed and are negative.  EKGs/Labs/Other Studies Reviewed:    The following studies were reviewed today: ***  EKG:  EKG is *** ordered today.  The ekg ordered today demonstrates ***  Recent Labs: 04/15/2017: ALT 18; BUN 14; Creatinine, Ser 1.12; Hemoglobin 14.7; Platelets 387; Potassium 4.0; Sodium 137 04/24/2017: TSH 1.050   Recent Lipid Panel No results found for: CHOL, TRIG, HDL, CHOLHDL, VLDL, LDLCALC, LDLDIRECT  Physical Exam:    VS:  There were no vitals taken for this visit.    Wt Readings from Last 3 Encounters:  04/24/17 256 lb 12.8 oz (116.5  kg)     GEN: *** Well nourished, well developed in no acute distress HEENT: Normal NECK: No JVD; No carotid bruits LYMPHATICS: No lymphadenopathy CARDIAC: ***RRR, no murmurs, rubs, gallops RESPIRATORY:  Clear to auscultation without rales, wheezing or rhonchi  ABDOMEN: Soft, non-tender, non-distended MUSCULOSKELETAL:  No edema; No deformity  SKIN: Warm and dry NEUROLOGIC:  Alert and oriented x 3 PSYCHIATRIC:  Normal affect   ASSESSMENT:    1. Palpitations    PLAN:    In order of problems listed above:  1. ***   Medication Adjustments/Labs and Tests Ordered: Current medicines are reviewed at length with the patient today.  Concerns regarding medicines are outlined above. Labs and tests ordered and medication changes are outlined in the patient instructions below:  There are no Patient Instructions on file for this visit.   Signed, Donato SchultzMark Bret Stamour, MD  05/26/2017 6:41 AM    [facility]
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
## 2017-06-12 ENCOUNTER — Encounter (INDEPENDENT_AMBULATORY_CARE_PROVIDER_SITE_OTHER): Payer: Self-pay

## 2017-06-12 ENCOUNTER — Encounter: Payer: Self-pay | Admitting: Cardiovascular Disease

## 2017-06-12 ENCOUNTER — Ambulatory Visit (INDEPENDENT_AMBULATORY_CARE_PROVIDER_SITE_OTHER): Payer: BLUE CROSS/BLUE SHIELD | Admitting: Cardiovascular Disease

## 2017-06-12 VITALS — BP 160/92 | HR 82 | Ht 69.5 in | Wt 247.0 lb

## 2017-06-12 DIAGNOSIS — R002 Palpitations: Secondary | ICD-10-CM | POA: Diagnosis not present

## 2017-06-12 MED ORDER — METOPROLOL SUCCINATE ER 25 MG PO TB24: 25.0000 mg | ORAL_TABLET | Freq: Every day | ORAL | 11 refills | Status: DC

## 2017-06-12 NOTE — Patient Instructions (Addendum)
Medication Instructions:  Your physician has recommended you make the following change in your medication:  1-START Metoprolol 25 mg by mouth daily   Labwork: NONE  Testing/Procedures: Your physician has requested that you have an exercise tolerance test. For further information please visit [email or web address] Please also follow instruction sheet, as given.  Your physician has recommended that you wear an event monitor. Event monitors are medical devices that record the heart's electrical activity. Doctors most often us these monitors to diagnose arrhythmias. Arrhythmias are problems with the speed or rhythm of the heartbeat. The monitor is a small, portable device. You can wear one while you do your normal daily activities. This is usually used to diagnose what is causing palpitations/syncope (passing out).  Follow-Up: Your physician wants you to follow-up in: next available with Dr. Eden EmmsNishan after test are complete.    If you need a refill on your cardiac medications before your next appointment, please call your pharmacy.

## 2017-07-01 ENCOUNTER — Ambulatory Visit (INDEPENDENT_AMBULATORY_CARE_PROVIDER_SITE_OTHER): Payer: BLUE CROSS/BLUE SHIELD

## 2017-07-01 ENCOUNTER — Encounter (INDEPENDENT_AMBULATORY_CARE_PROVIDER_SITE_OTHER): Payer: Self-pay

## 2017-07-01 DIAGNOSIS — R002 Palpitations: Secondary | ICD-10-CM

## 2017-07-01 LAB — EXERCISE TOLERANCE TEST
CHL CUP STRESS STAGE 1 DBP: 93 mmHg
CHL CUP STRESS STAGE 1 GRADE: 0 %
CHL CUP STRESS STAGE 1 SBP: 143 mmHg
CHL CUP STRESS STAGE 1 SPEED: 0 mph
CHL CUP STRESS STAGE 2 HR: 97 {beats}/min
CHL CUP STRESS STAGE 3 SPEED: 1 mph
CHL CUP STRESS STAGE 4 SPEED: 1 mph
CHL CUP STRESS STAGE 5 DBP: 80 mmHg
CHL CUP STRESS STAGE 5 HR: 129 {beats}/min
CHL CUP STRESS STAGE 5 SBP: 170 mmHg
CHL CUP STRESS STAGE 5 SPEED: 1.7 mph
CHL CUP STRESS STAGE 6 DBP: 66 mmHg
CHL CUP STRESS STAGE 6 HR: 142 {beats}/min
CHL CUP STRESS STAGE 6 SBP: 169 mmHg
CHL CUP STRESS STAGE 7 SPEED: 3.4 mph
CHL CUP STRESS STAGE 8 GRADE: 0 %
CHL CUP STRESS STAGE 8 SPEED: 0 mph
CHL CUP STRESS STAGE 9 DBP: 69 mmHg
CHL CUP STRESS STAGE 9 GRADE: 0 %
CHL CUP STRESS STAGE 9 SBP: 169 mmHg
CHL CUP STRESS STAGE 9 SPEED: 0 mph
Estimated workload: 10.1 METS
Exercise duration (min): 8 min
Exercise duration (sec): 0 s
MPHR: 183 {beats}/min
Peak HR: 162 {beats}/min
Percent HR: 89 %
Percent of predicted max HR: 88 %
RPE: 17
Rest HR: 97 {beats}/min
Stage 1 HR: 96 {beats}/min
Stage 2 Grade: 0 %
Stage 2 Speed: 0 mph
Stage 3 Grade: 0 %
Stage 3 HR: 100 {beats}/min
Stage 4 Grade: 0 %
Stage 4 HR: 100 {beats}/min
Stage 5 Grade: 10 %
Stage 6 Grade: 12 %
Stage 6 Speed: 2.5 mph
Stage 7 Grade: 14 %
Stage 7 HR: 162 {beats}/min
Stage 8 DBP: 59 mmHg
Stage 8 HR: 150 {beats}/min
Stage 8 SBP: 149 mmHg
Stage 9 HR: 116 {beats}/min

## 2017-07-24 ENCOUNTER — Encounter (HOSPITAL_COMMUNITY): Payer: Self-pay | Admitting: Emergency Medicine

## 2017-07-24 ENCOUNTER — Emergency Department (HOSPITAL_COMMUNITY)
Admission: EM | Admit: 2017-07-24 | Discharge: 2017-07-24 | Disposition: A | Discharge status: Home / Self Care | Payer: BLUE CROSS/BLUE SHIELD | Attending: Emergency Medicine | Admitting: Emergency Medicine

## 2017-07-24 DIAGNOSIS — F17210 Nicotine dependence, cigarettes, uncomplicated: Secondary | ICD-10-CM | POA: Insufficient documentation

## 2017-07-24 DIAGNOSIS — M79605 Pain in left leg: Secondary | ICD-10-CM | POA: Diagnosis present

## 2017-07-24 MED ORDER — TRAMADOL HCL 50 MG PO TABS: 50.0000 mg | ORAL_TABLET | Freq: Two times a day (BID) | ORAL | 0 refills | Status: DC | PRN

## 2017-07-24 NOTE — ED Notes (Signed)
Also note pt is ambulatory around department with steady gait.

## 2017-07-24 NOTE — Discharge Instructions (Signed)
To find a primary care or specialty doctor please call [phone] or [phone] to access "[facility] Find a Doctor Service." ° °You may also go on the [facility] website at [email or web address] ° °There are also multiple [facility], [facility] practices throughout the [place] that are frequently accepting new patients. You may find a clinic that is close to your home and contact them. ° °[facility] -  °[address] °[place] [address] °[phone] ° ° °[facility] -  °[address] ° [place] [address] [phone] ° ° °[facility] - °[address]  °[place] [address] °[phone] ° ° °

## 2017-07-24 NOTE — ED Triage Notes (Signed)
Pt reports L leg pain present since last night, cramping 7/10. States that "it starts aching/cramping whenever it rains, I had surgery on it back in 09"
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
## 2017-07-24 NOTE — ED Notes (Signed)
Pt states he has a rod in his left femur and a rebuilt left knee. Pt states he woke up for work at 2115 last evening and his leg was hurting him. Pt denies any injury and states he has a prescription for muscle relaxers but doesn't take them all the time because they make him sleepy.
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
## 2017-08-13 ENCOUNTER — Encounter (HOSPITAL_COMMUNITY): Payer: Self-pay | Admitting: *Deleted

## 2017-08-13 DIAGNOSIS — F17210 Nicotine dependence, cigarettes, uncomplicated: Secondary | ICD-10-CM | POA: Diagnosis not present

## 2017-08-13 DIAGNOSIS — R112 Nausea with vomiting, unspecified: Secondary | ICD-10-CM | POA: Diagnosis not present

## 2017-08-13 DIAGNOSIS — Z79899 Other long term (current) drug therapy: Secondary | ICD-10-CM | POA: Diagnosis not present

## 2017-08-13 DIAGNOSIS — R51 Headache: Secondary | ICD-10-CM | POA: Diagnosis not present

## 2017-08-13 LAB — CBC
HCT: 45 % (ref 39.0–52.0)
Hemoglobin: 15.3 g/dL (ref 13.0–17.0)
MCH: 29.1 pg (ref 26.0–34.0)
MCHC: 34 g/dL (ref 30.0–36.0)
MCV: 85.7 fL (ref 78.0–100.0)
PLATELETS: 294 10*3/uL (ref 150–400)
RBC: 5.25 MIL/uL (ref 4.22–5.81)
RDW: 15.1 % (ref 11.5–15.5)
WBC: 8.3 10*3/uL (ref 4.0–10.5)

## 2017-08-13 LAB — COMPREHENSIVE METABOLIC PANEL
ALBUMIN: 4.1 g/dL (ref 3.5–5.0)
ALK PHOS: 63 U/L (ref 38–126)
ALT: 18 U/L (ref 17–63)
AST: 30 U/L (ref 15–41)
Anion gap: 11 (ref 5–15)
BUN: 11 mg/dL (ref 6–20)
CHLORIDE: 103 mmol/L (ref 101–111)
CO2: 22 mmol/L (ref 22–32)
CREATININE: 1.21 mg/dL (ref 0.61–1.24)
Calcium: 9.4 mg/dL (ref 8.9–10.3)
GFR calc Af Amer: >60 mL/min (ref >60)
GFR calc non Af Amer: >60 mL/min (ref >60)
GLUCOSE: 107 mg/dL — AB (ref 65–99)
Potassium: 4 mmol/L (ref 3.5–5.1)
SODIUM: 136 mmol/L (ref 135–145)
Total Bilirubin: 0.8 mg/dL (ref 0.3–1.2)
Total Protein: 7.8 g/dL (ref 6.5–8.1)

## 2017-08-13 LAB — URINALYSIS, ROUTINE W REFLEX MICROSCOPIC
Bilirubin Urine: NEGATIVE
GLUCOSE, UA: NEGATIVE mg/dL
HGB URINE DIPSTICK: NEGATIVE
KETONES UR: NEGATIVE mg/dL
Leukocytes, UA: NEGATIVE
Nitrite: NEGATIVE
PROTEIN: NEGATIVE mg/dL
Specific Gravity, Urine: 1.026 (ref 1.005–1.030)
pH: 5 (ref 5.0–8.0)

## 2017-08-13 LAB — LIPASE, BLOOD: Lipase: 28 U/L (ref 11–51)

## 2017-08-13 MED ORDER — ACETAMINOPHEN 500 MG PO TABS
ORAL_TABLET | ORAL | Status: AC
  Filled 2017-08-13: qty 2

## 2017-08-13 MED ORDER — ONDANSETRON 4 MG PO TBDP
ORAL_TABLET | ORAL | Status: AC
  Filled 2017-08-13: qty 1

## 2017-08-13 MED ORDER — ONDANSETRON 4 MG PO TBDP
4.0000 mg | ORAL_TABLET | Freq: Once | ORAL | Status: AC | PRN
  Administered 2017-08-13: 4 mg via ORAL

## 2017-08-13 MED ORDER — ACETAMINOPHEN 500 MG PO TABS
1000.0000 mg | ORAL_TABLET | Freq: Once | ORAL | Status: AC
  Administered 2017-08-13: 1000 mg via ORAL

## 2017-08-13 NOTE — ED Triage Notes (Signed)
Pt reports vomiting and diarrhea since Sunday. 5 episodes of vomiting today, 6 episodes of diarrhea today. New onset of migraine today.

## 2017-08-14 ENCOUNTER — Ambulatory Visit: Payer: BLUE CROSS/BLUE SHIELD | Admitting: Cardiovascular Disease

## 2017-08-14 ENCOUNTER — Emergency Department (HOSPITAL_COMMUNITY)
Admission: EM | Admit: 2017-08-14 | Discharge: 2017-08-14 | Disposition: A | Discharge status: Home / Self Care | Payer: BLUE CROSS/BLUE SHIELD | Attending: Emergency Medicine | Admitting: Emergency Medicine

## 2017-08-14 DIAGNOSIS — R112 Nausea with vomiting, unspecified: Secondary | ICD-10-CM

## 2017-08-14 DIAGNOSIS — R197 Diarrhea, unspecified: Secondary | ICD-10-CM

## 2017-08-14 DIAGNOSIS — R519 Headache, unspecified: Secondary | ICD-10-CM

## 2017-08-14 DIAGNOSIS — R51 Headache: Secondary | ICD-10-CM

## 2017-08-14 MED ORDER — KETOROLAC TROMETHAMINE 60 MG/2ML IM SOLN
60.0000 mg | Freq: Once | INTRAMUSCULAR | Status: AC
  Administered 2017-08-14: 60 mg via INTRAMUSCULAR
  Filled 2017-08-14: qty 2

## 2017-08-14 MED ORDER — PROMETHAZINE HCL 25 MG PO TABS
25.0000 mg | ORAL_TABLET | Freq: Four times a day (QID) | ORAL | 0 refills | Status: DC | PRN
Start: 2017-08-14 — End: 2017-12-18

## 2017-08-14 MED ORDER — ONDANSETRON 4 MG PO TBDP
4.0000 mg | ORAL_TABLET | Freq: Three times a day (TID) | ORAL | 0 refills | Status: DC | PRN
Start: 2017-08-14 — End: 2017-12-18

## 2017-08-14 MED ORDER — ONDANSETRON 4 MG PO TBDP
4.0000 mg | ORAL_TABLET | Freq: Once | ORAL | Status: AC
  Administered 2017-08-14: 4 mg via ORAL
  Filled 2017-08-14: qty 1

## 2017-08-14 NOTE — ED Provider Notes (Signed)
[facility]-EMERGENCY DEPT Provider Note   CSN: [id number] Arrival date & time: 08/13/17  2053     History   Chief Complaint Chief Complaint  Patient presents with  . Emesis    HPI Alexander Mccarthy is a 36 y.o. male.  HPI  36 year old male presents with vomiting and diarrhea for 4 days. 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 When he sleeps he does not have any of the symptoms. He has been able to drink some water but food does not stay down. He mostly gets nausea when trying to eat or drink. There is no hematemesis. The diarrhea is a mix of trauma formed stool and some mucus. No travel, camping, or recent antibiotics. Some times he feels bloated but he has not had abdominal pain. He felt subjective warmth and chills but has not taken his temperature. He's also been getting an on and off right-sided headache that feels like prior migraines. Typically this is after vomiting. He took 2 Tylenol tonight and the headache is better. Currently the headache is a 4/10.  Past Medical History:  Diagnosis Date  . Elevated blood pressure reading without diagnosis of hypertension 05/01/2017  . Hyperglycemia 05/01/2017  . Lower extremity pain 05/01/2017  . Palpitations 05/01/2017    Patient Active Problem List   Diagnosis Date Noted  . Lower extremity pain 05/01/2017  . Palpitations 05/01/2017  . Elevated blood pressure reading without diagnosis of hypertension 05/01/2017  . Hyperglycemia 05/01/2017    Past Surgical History:  Procedure Laterality Date  . FEMUR FRACTURE SURGERY    . LEG SURGERY         Home Medications    Prior to Admission medications   Medication Sig Start Date End Date Taking? Authorizing Provider  methocarbamol (ROBAXIN) 500 MG tablet Take 1 tablet (500 mg total) by mouth 3 (three) times daily. X 10days then prn muscle spasm 04/24/17   Georgian Co M, PA-C  metoprolol succinate (TOPROL XL) 25 MG 24 hr tablet Take 1 tablet (25 mg  total) by mouth daily. 06/12/17   Wendall Stade, MD  naproxen (NAPROSYN) 500 MG tablet Take 1 tablet (500 mg total) by mouth 2 (two) times daily with a meal. X 10 days then prn pain 04/24/17   Georgian Co M, PA-C  naproxen sodium (ANAPROX) 220 MG tablet Take 440 mg by mouth every 12 (twelve) hours as needed (pain).    [provider]  ondansetron (ZOFRAN ODT) 4 MG disintegrating tablet Take 1 tablet (4 mg total) by mouth every 8 (eight) hours as needed for nausea or vomiting. 08/14/17   Pricilla Loveless, MD  promethazine (PHENERGAN) 25 MG tablet Take 1 tablet (25 mg total) by mouth every 6 (six) hours as needed for nausea or vomiting. 08/14/17   Pricilla Loveless, MD  traMADol (ULTRAM) 50 MG tablet Take 1 tablet (50 mg total) by mouth every 12 (twelve) hours as needed. 07/24/17   Ward, Layla Maw, DO  Vitamin D, Ergocalciferol, (DRISDOL) 50000 units CAPS capsule Take 1 capsule (50,000 Units total) by mouth every 7 (seven) days. 04/25/17   Anders Simmonds, PA-C    Family History Family History  Problem Relation Age of Onset  . Hypertension Mother   . Asthma Other     Social History Social History  Substance Use Topics  . Smoking status: Current Every Day Smoker    Packs/day: 0.50    Years: 10.00    Types: Cigarettes  . Smokeless  tobacco: Never Used  . Alcohol use No     Allergies   Patient has no known allergies.   Review of Systems Review of Systems  Respiratory: Negative for shortness of breath.   Cardiovascular: Negative for chest pain.  Gastrointestinal: Positive for diarrhea, nausea and vomiting. Negative for abdominal pain.  Neurological: Positive for headaches. Negative for dizziness and light-headedness.  All other systems reviewed and are negative.    Physical Exam Updated Vital Signs BP (!) 152/79 (BP Location: Right Arm)   Pulse 72   Temp 98.4 F (36.9 C) (Oral)   Resp 16   Ht 6\' 1"  (1.854 m)   Wt 117.9 kg (260 lb)   SpO2 94%   BMI 34.30 kg/m    Physical Exam  Constitutional: He is oriented to person, place, and time. He appears well-developed and well-nourished.  HENT:  Head: Normocephalic and atraumatic.  Right Ear: External ear normal.  Left Ear: External ear normal.  Nose: Nose normal.  Mouth/Throat: Oropharynx is clear and moist.  Eyes: Pupils are equal, round, and reactive to light. EOM are normal. Right eye exhibits no discharge. Left eye exhibits no discharge.  Neck: Normal range of motion. Neck supple.  Cardiovascular: Normal rate, regular rhythm and normal heart sounds.   Pulmonary/Chest: Effort normal and breath sounds normal.  Abdominal: Soft. He exhibits no distension. There is no tenderness.  Musculoskeletal: He exhibits no edema.  Neurological: He is alert and oriented to person, place, and time.  CN 3-12 grossly intact. 5/5 strength in all 4 extremities. Grossly normal sensation. Normal finger to nose.   Skin: Skin is warm and dry.  Nursing note and vitals reviewed.    ED Treatments / Results  Labs (all labs ordered are listed, but only abnormal results are displayed) Labs Reviewed  COMPREHENSIVE METABOLIC PANEL - Abnormal; Notable for the following:       Result Value   Glucose, Bld 107 (*)    All other components within normal limits  LIPASE, BLOOD  CBC  URINALYSIS, ROUTINE W REFLEX MICROSCOPIC    EKG  EKG Interpretation None       Radiology No results found.  Procedures Procedures (including critical care time)  Medications Ordered in ED Medications  ketorolac (TORADOL) injection 60 mg (not administered)  ondansetron (ZOFRAN-ODT) disintegrating tablet 4 mg (4 mg Oral Given 08/13/17 2106)  acetaminophen (TYLENOL) tablet 1,000 mg (1,000 mg Oral Given 08/13/17 2230)     Initial Impression / Assessment and Plan / ED Course  I have reviewed the triage vital signs and the nursing notes.  Pertinent labs & imaging results that were available during my care of the patient were reviewed by  me and considered in my medical decision making (see chart for details).     Patient overall appears well. His headache is probably related to dehydration causing recurrent migraines. Given the one-sided symptoms this sounds like a migraine. Low suspicion for other CNS emergency. His headache is better after the Tylenol, I will give him IM Toradol and see is requesting something further for the headache. I offered him IV fluids but he declines. He does not appear overtly dehydrated. I discussed using anti-emetics for the nausea and vomiting as well as Pepto-Bismol and Imodium as needed for diarrhea. Low suspicion for invasive disease. Abdominal exam benign. Discussed return precautions as well as need for PCP follow-up.  Final Clinical Impressions(s) / ED Diagnoses   Final diagnoses:  Nausea vomiting and diarrhea  Right-sided headache  New Prescriptions New Prescriptions   ONDANSETRON (ZOFRAN ODT) 4 MG DISINTEGRATING TABLET    Take 1 tablet (4 mg total) by mouth every 8 (eight) hours as needed for nausea or vomiting.   PROMETHAZINE (PHENERGAN) 25 MG TABLET    Take 1 tablet (25 mg total) by mouth every 6 (six) hours as needed for nausea or vomiting.     Pricilla Loveless, MD 08/14/17 [id number]
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
## 2017-09-18 ENCOUNTER — Ambulatory Visit (INDEPENDENT_AMBULATORY_CARE_PROVIDER_SITE_OTHER): Payer: BLUE CROSS/BLUE SHIELD | Admitting: Cardiovascular Disease

## 2017-09-18 ENCOUNTER — Encounter (INDEPENDENT_AMBULATORY_CARE_PROVIDER_SITE_OTHER): Payer: Self-pay

## 2017-09-18 ENCOUNTER — Encounter: Payer: Self-pay | Admitting: Cardiovascular Disease

## 2017-09-18 VITALS — BP 112/62 | HR 94 | Ht 71.0 in | Wt 249.0 lb

## 2017-09-18 DIAGNOSIS — R002 Palpitations: Secondary | ICD-10-CM

## 2017-09-18 NOTE — Patient Instructions (Signed)

## 2017-12-18 ENCOUNTER — Encounter (HOSPITAL_COMMUNITY): Payer: Self-pay

## 2017-12-18 ENCOUNTER — Other Ambulatory Visit: Payer: Self-pay

## 2017-12-18 ENCOUNTER — Emergency Department (HOSPITAL_COMMUNITY)
Admission: EM | Admit: 2017-12-18 | Discharge: 2017-12-18 | Disposition: A | Discharge status: Home / Self Care | Payer: BLUE CROSS/BLUE SHIELD | Attending: Emergency Medicine | Admitting: Emergency Medicine

## 2017-12-18 ENCOUNTER — Emergency Department (HOSPITAL_COMMUNITY): Payer: BLUE CROSS/BLUE SHIELD

## 2017-12-18 DIAGNOSIS — F17210 Nicotine dependence, cigarettes, uncomplicated: Secondary | ICD-10-CM | POA: Diagnosis not present

## 2017-12-18 DIAGNOSIS — R05 Cough: Secondary | ICD-10-CM | POA: Insufficient documentation

## 2017-12-18 DIAGNOSIS — R079 Chest pain, unspecified: Secondary | ICD-10-CM | POA: Diagnosis not present

## 2017-12-18 DIAGNOSIS — R55 Syncope and collapse: Secondary | ICD-10-CM

## 2017-12-18 LAB — BASIC METABOLIC PANEL
Anion gap: 10 (ref 5–15)
BUN: 17 mg/dL (ref 6–20)
CHLORIDE: 101 mmol/L (ref 101–111)
CO2: 26 mmol/L (ref 22–32)
CREATININE: 1.12 mg/dL (ref 0.61–1.24)
Calcium: 8.5 mg/dL — ABNORMAL LOW (ref 8.9–10.3)
GFR calc Af Amer: >60 mL/min (ref >60)
GFR calc non Af Amer: >60 mL/min (ref >60)
GLUCOSE: 129 mg/dL — AB (ref 65–99)
Potassium: 3.9 mmol/L (ref 3.5–5.1)
Sodium: 137 mmol/L (ref 135–145)

## 2017-12-18 LAB — CBC
HCT: 44.3 % (ref 39.0–52.0)
Hemoglobin: 14.8 g/dL (ref 13.0–17.0)
MCH: 29.3 pg (ref 26.0–34.0)
MCHC: 33.4 g/dL (ref 30.0–36.0)
MCV: 87.7 fL (ref 78.0–100.0)
PLATELETS: 291 10*3/uL (ref 150–400)
RBC: 5.05 MIL/uL (ref 4.22–5.81)
RDW: 14.4 % (ref 11.5–15.5)
WBC: 5.7 10*3/uL (ref 4.0–10.5)

## 2017-12-18 LAB — I-STAT TROPONIN, ED: Troponin i, poc: 0 ng/mL (ref 0.00–0.08)

## 2017-12-18 MED ORDER — SODIUM CHLORIDE 0.9 % IV BOLUS (SEPSIS)
1000.0000 mL | Freq: Once | INTRAVENOUS | Status: AC
  Administered 2017-12-18: 1000 mL via INTRAVENOUS

## 2017-12-18 NOTE — ED Triage Notes (Signed)
Pt states he was at working had syncopal episode; Pt denies hitting head; pt states unsure of how long he was out; pt states he was sent home from work and came to make sure he was ok. Pt c/of chest tightness when he came to. Pt states chest tightness at 6/10 on arrival. Pt a&ox 4-Monique,RN

## 2017-12-18 NOTE — ED Notes (Signed)
Pt able to ambulate in the hall w/o difficulty

## 2017-12-18 NOTE — ED Provider Notes (Signed)
[facility] Provider Note   CSN: [id number] Arrival date & time: 12/18/17  0219     History   Chief Complaint Chief Complaint  Patient presents with  . Loss of Consciousness  . Chest Pain    HPI Alexander Mccarthy is a 36 y.o. male.  The history is provided by the patient.  Loss of Consciousness   This is a new problem. Episode onset: Just prior to arrival. The problem has been gradually improving. He lost consciousness for a period of less than one minute. Associated symptoms include chest pain. Pertinent negatives include abdominal pain, bladder incontinence, fever, headaches, seizures and vomiting. Treatments tried: Rest. The treatment provided moderate relief. His past medical history does not include CAD or seizures.  Chest Pain   Associated symptoms include cough and syncope. Pertinent negatives include no abdominal pain, no fever, no headaches, no shortness of breath and no vomiting.  Pertinent negatives for past medical history include no CAD and no seizures.  Patient reports while he was at work tonight he had a syncopal episode. He denies any injuries from the fall. He reports this is a brief event, no seizures reported He reports he was walking to the bathroom when the next thing he knew he fell and he found himself on the floor No fumes or chemical exposures reported He reports mild chest tightness, but none at this time, no pleuritic chest pain He denies any active shortness of breath this time He reports recent diagnosis of bronchitis has been taking prednisone, doxycycline, cough medicines He reports before going to work he is having some cough and chest pain induced by cough prior to work No history of CAD, no history of PE/DVT   Past Medical History:  Diagnosis Date  . Elevated blood pressure reading without diagnosis of hypertension 05/01/2017  . Hyperglycemia 05/01/2017  . Lower extremity pain 05/01/2017  .  Palpitations 05/01/2017    Patient Active Problem List   Diagnosis Date Noted  . Lower extremity pain 05/01/2017  . Palpitations 05/01/2017  . Elevated blood pressure reading without diagnosis of hypertension 05/01/2017  . Hyperglycemia 05/01/2017    Past Surgical History:  Procedure Laterality Date  . FEMUR FRACTURE SURGERY    . LEG SURGERY         Home Medications    Prior to Admission medications   Medication Sig Start Date End Date Taking? Authorizing Provider  doxycycline (VIBRAMYCIN) 100 MG capsule Take 100 mg by mouth 2 (two) times daily. Started 12/11/17 for 10 days 12/11/17 12/21/17 Yes [provider]  methocarbamol (ROBAXIN) 500 MG tablet Take 1 tablet (500 mg total) by mouth 3 (three) times daily. X 10days then prn muscle spasm Patient not taking: Reported on 12/18/2017 04/24/17   Anders SimmondsMcClung, Angela M, PA-C  metoprolol succinate (TOPROL XL) 25 MG 24 hr tablet Take 1 tablet (25 mg total) by mouth daily. Patient not taking: Reported on 12/18/2017 06/12/17   Wendall StadeNishan, Peter C, MD  naproxen (NAPROSYN) 500 MG tablet Take 1 tablet (500 mg total) by mouth 2 (two) times daily with a meal. X 10 days then prn pain Patient not taking: Reported on 12/18/2017 04/24/17   Anders SimmondsMcClung, Angela M, PA-C  ondansetron (ZOFRAN ODT) 4 MG disintegrating tablet Take 1 tablet (4 mg total) by mouth every 8 (eight) hours as needed for nausea or vomiting. Patient not taking: Reported on 12/18/2017 08/14/17   Pricilla LovelessGoldston, Scott, MD  promethazine (PHENERGAN) 25 MG tablet Take 1 tablet (25  mg total) by mouth every 6 (six) hours as needed for nausea or vomiting. Patient not taking: Reported on 12/18/2017 08/14/17   Pricilla Loveless, MD  traMADol (ULTRAM) 50 MG tablet Take 1 tablet (50 mg total) by mouth every 12 (twelve) hours as needed. Patient not taking: Reported on 12/18/2017 07/24/17   Ward, Layla Maw, DO  Vitamin D, Ergocalciferol, (DRISDOL) 50000 units CAPS capsule Take 1 capsule (50,000 Units total) by  mouth every 7 (seven) days. Patient not taking: Reported on 12/18/2017 04/25/17   Anders Simmonds, PA-C    Family History Family History  Problem Relation Age of Onset  . Hypertension Mother   . Asthma Other     Social History Social History   Tobacco Use  . Smoking status: Current Every Day Smoker    Packs/day: 0.50    Years: 10.00    Pack years: 5.00    Types: Cigarettes  . Smokeless tobacco: Never Used  Substance Use Topics  . Alcohol use: Yes  . Drug use: No     Allergies   Patient has no known allergies.   Review of Systems Review of Systems  Constitutional: Negative for fever.  Respiratory: Positive for cough. Negative for shortness of breath.   Cardiovascular: Positive for chest pain and syncope.  Gastrointestinal: Negative for abdominal pain and vomiting.  Genitourinary: Negative for bladder incontinence.  Neurological: Positive for syncope. Negative for seizures and headaches.  All other systems reviewed and are negative.    Physical Exam Updated Vital Signs BP (!) 159/100 (BP Location: Left Arm)   Pulse 81   Temp 97.9 F (36.6 C) (Oral)   Resp 16   SpO2 99%   Physical Exam  CONSTITUTIONAL: Well developed/well nourished HEAD: Normocephalic/atraumatic EYES: EOMI/PERRL ENMT: Mucous membranes moist, no tongue lacerations NECK: supple no meningeal signs SPINE/BACK:entire spine nontender CV: S1/S2 noted, no murmurs/rubs/gallops noted LUNGS: Lungs are clear to auscultation bilaterally, no apparent distress ABDOMEN: soft, nontender, no rebound or guarding, bowel sounds noted throughout abdomen GU:no cva tenderness NEURO: Pt is awake/alert/appropriate, moves all extremitiesx4.  No facial droop.,  No arm or leg drift EXTREMITIES: pulses normal/equal, full ROM, no calf tenderness or edema SKIN: warm, color normal PSYCH: no abnormalities of mood noted, alert and oriented to situation  ED Treatments / Results  Labs (all labs ordered are listed, but  only abnormal results are displayed) Labs Reviewed  BASIC METABOLIC PANEL - Abnormal; Notable for the following components:      Result Value   Glucose, Bld 129 (*)    Calcium 8.5 (*)    All other components within normal limits  CBC  I-STAT TROPONIN, ED    EKG  EKG Interpretation  Date/Time:  Thursday December 18 2017 02:21:42 EST Ventricular Rate:  77 PR Interval:  154 QRS Duration: 80 QT Interval:  376 QTC Calculation: 425 R Axis:   85 Text Interpretation:  Normal sinus rhythm Normal ECG No significant change since last tracing Confirmed by Zadie Rhine ([id number]) on 12/18/2017 3:11:30 AM       Radiology Dg Chest 2 View  Result Date: 12/18/2017 CLINICAL DATA:  Acute onset of generalized chest tightness. Syncope. EXAM: CHEST  2 VIEW COMPARISON:  Chest radiograph performed 04/15/2017 FINDINGS: The lungs are well-aerated and clear. There is no evidence of focal opacification, pleural effusion or pneumothorax. The heart is normal in size; the mediastinal contour is within normal limits. No acute osseous abnormalities are seen. IMPRESSION: No acute cardiopulmonary process seen. Electronically Signed  By: Roanna RaiderJeffery  Chang M.D.   On: 12/18/2017 03:26    Procedures Procedures  Medications Ordered in ED Medications  sodium chloride 0.9 % bolus 1,000 mL (1,000 mLs Intravenous New Bag/Given 12/18/17 0400)     Initial Impression / Assessment and Plan / ED Course  I have reviewed the triage vital signs and the nursing notes.  Pertinent labs & imaging results that were available during my care of the patient were reviewed by me and considered in my medical decision making (see chart for details).     3:52 AM Patient reports syncopal episode while at work. He is well-appearing this time, EKG is unchanged, labs and imaging unremarkable As this was a brief isolated event, my suspicion for cardiac dysrhythmia is low Been managed by cardiology prior to this for palpitations, but  no echocardiogram has been done in system We will give IV fluids and reassess 5:16 AM Improved, requesting discharge home, and he is walking well For isolated episode of syncope, I feel he is safe for discharge No hypoxia or tachycardia to suggest acute PE He can follow-up with cardiology as an outpatient for outpatient echocardiogram But I did discuss going back to work, as he operates heavy machinery I did express concern about this nature of his job and his recent history of syncope Indicated in his discharge paperwork that he should limit his operation of machinery until seen by cardiology Final Clinical Impressions(s) / ED Diagnoses   Final diagnoses:  Syncope and collapse    ED Discharge Orders    None       Zadie RhineWickline, Jessaca Philippi, MD 12/18/17 [id number]

## 2017-12-25 ENCOUNTER — Telehealth: Payer: Self-pay | Admitting: Cardiovascular Disease

## 2017-12-25 NOTE — Telephone Encounter (Signed)
New message ° °Pt verbalized that he is calling for the rn °

## 2017-12-25 NOTE — Telephone Encounter (Signed)
Called patient back about message. Patient stated his work will not let him work until he gets cleared with his cardiologist. Patient was in ED for syncope and collapse. Patient stated he was there for dehydration. Made an appointment with Boyce MediciBrittany Simmons PA for tomorrow. Patient is wanting to go back to work, and cannot stay out of work for long. Informed patient he will be evaluated at his appointment to see if he is fine to go back to work full duty, since his job does not have any light duty jobs. Patient verbalized understanding.

## 2017-12-26 ENCOUNTER — Ambulatory Visit: Payer: BLUE CROSS/BLUE SHIELD | Admitting: Cardiology

## 2018-01-08 NOTE — Progress Notes (Signed)
Cardiology Office Note   Date:  01/09/2018   ID:  Alexander ShownSentellis D Ingalls, DOB 09-29-1981, MRN [id number]  PCP:  Patient, No Pcp Per  Cardiologist:  Dr. Eden EmmsNishan     Chief Complaint  Patient presents with  . Hospitalization Follow-up    stable from syncope      History of Present Illness: Alexander Mccarthy is a 37 y.o. male who presents for post hospitalization for syncope.  12/18/17  Troponin was ng other labs stable.    ETT reviewed from 07/01/17 Normal Event monitor: 07/01/17  NSR no arrhythmia   Started on Toprol for palpitations and labile BP better now Still some fatigue from working 3rd shift at [employer].  Pt had syncope after going to [employer], coming back and passed out.  Previous holter with SR.  No awareness of dizziness or awareness of heart rate.  No problems since that time.  No lightheadedness no dizziness.  He would also like the refill of naproxen and tramadol for muscle pain.  He has decreased his tobacco to 3 X week.   He is no longer on metoprolol.  He is eating and drinking well.  He did have a cold prior to this episode. No chest pain.   Past Medical History:  Diagnosis Date  . Elevated blood pressure reading without diagnosis of hypertension 05/01/2017  . Hyperglycemia 05/01/2017  . Lower extremity pain 05/01/2017  . Palpitations 05/01/2017    Past Surgical History:  Procedure Laterality Date  . FEMUR FRACTURE SURGERY    . LEG SURGERY       Current Outpatient Medications  Medication Sig Dispense Refill  . traMADol (ULTRAM) 50 MG tablet Take 1 tablet (50 mg total) by mouth every 8 (eight) hours as needed. 30 tablet 0  . naproxen (NAPROSYN) 500 MG tablet Take 1 tablet (500 mg total) by mouth 2 (two) times daily with a meal. X 10 days then prn pain 60 tablet 1   No current facility-administered medications for this visit.     Allergies:   Patient has no known allergies.    Social History:  The patient  reports that he has been smoking cigarettes.  He  has a 5.00 pack-year smoking history. he has never used smokeless tobacco. He reports that he drinks alcohol. He reports that he does not use drugs.   Family History:  The patient's family history includes Asthma in his other; Hypertension in his mother.    ROS:  General:+ colds a month ago no fevers, no weight changes Skin:no rashes or ulcers HEENT:no blurred vision, no congestion CV:see HPI PUL:see HPI GI:no diarrhea constipation or melena, no indigestion GU:no hematuria, no dysuria MS:+ joint pain, no claudication Neuro:no syncope, no lightheadedness Endo:no diabetes, no thyroid disease   Wt Readings from Last 3 Encounters:  01/09/18 257 lb (116.6 kg)  09/18/17 249 lb (112.9 kg)  08/13/17 260 lb (117.9 kg)     PHYSICAL EXAM: VS:  BP 112/70   Pulse (!) 104   Resp 16   Ht 6\' 1"  (1.854 m)   Wt 257 lb (116.6 kg)   SpO2 96%   BMI 33.91 kg/m  , BMI Body mass index is 33.91 kg/m. General:Pleasant affect, NAD Skin:Warm and dry, brisk capillary refill HEENT:normocephalic, sclera clear, mucus membranes moist Neck:supple, no JVD, no bruits  Heart:S1S2 RRR without murmur, gallup, rub or click Lungs:clear without rales, rhonchi, or wheezes FAO:ZHYQAbd:soft, non tender, + BS, do not palpate liver spleen or masses Ext:no lower ext  edema, 2+ pedal pulses, 2+ radial pulses Neuro:alert and oriented, MAE, follows commands, + facial symmetry    EKG:  EKG is NOT ordered today. The ekg from the ER was reviewed.  No acute changes.    Recent Labs: 04/24/2017: TSH 1.050 08/13/2017: ALT 18 12/18/2017: BUN 17; Creatinine, Ser 1.12; Hemoglobin 14.8; Platelets 291; Potassium 3.9; Sodium 137    Lipid Panel No results found for: CHOL, TRIG, HDL, CHOLHDL, VLDL, LDLCALC, LDLDIRECT     Other studies Reviewed: Additional studies/ records that were reviewed today include: see above.   ASSESSMENT AND PLAN:  1.  Syncope - will do echo to eval structure and have pt follow up with Dr. 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  If he were to pass out he could not injure himself or others with the equipment, he could injure his head but this is with any walking.  He needs to work, he is out of money.  Ok to return to work.  Drink plenty of fluids.   2.  Palpitations no further episodes but not on BB.  BP is lower today so will hold.      Current medicines are reviewed with the patient today.  The patient Has no concerns regarding medicines.  The following changes have been made:  See above Labs/ tests ordered today include:see above  Disposition:   FU:  see above  Signed, Nada Boozer, NP  01/09/2018 6:05 PM    [facility] [address] [place], [place], [place]  [address]/ [address] [place], [place] Phone: [phone]; Fax: [phone]  [phone]

## 2018-01-09 ENCOUNTER — Ambulatory Visit (INDEPENDENT_AMBULATORY_CARE_PROVIDER_SITE_OTHER): Payer: BLUE CROSS/BLUE SHIELD | Admitting: Cardiology

## 2018-01-09 ENCOUNTER — Encounter: Payer: Self-pay | Admitting: Cardiology

## 2018-01-09 ENCOUNTER — Encounter: Payer: Self-pay | Admitting: *Deleted

## 2018-01-09 VITALS — BP 112/70 | HR 104 | Resp 16 | Ht 73.0 in | Wt 257.0 lb

## 2018-01-09 DIAGNOSIS — R55 Syncope and collapse: Secondary | ICD-10-CM

## 2018-01-09 DIAGNOSIS — M79605 Pain in left leg: Secondary | ICD-10-CM | POA: Diagnosis not present

## 2018-01-09 DIAGNOSIS — R002 Palpitations: Secondary | ICD-10-CM | POA: Diagnosis not present

## 2018-01-09 MED ORDER — NAPROXEN 500 MG PO TABS: 500.0000 mg | ORAL_TABLET | Freq: Two times a day (BID) | ORAL | 1 refills | Status: DC

## 2018-01-09 MED ORDER — TRAMADOL HCL 50 MG PO TABS: 50.0000 mg | ORAL_TABLET | Freq: Three times a day (TID) | ORAL | 0 refills | Status: DC | PRN

## 2018-01-09 NOTE — Patient Instructions (Addendum)
Medication Instructions:  Your physician recommends that you continue on your current medications as directed. Please refer to the Current Medication list given to you today.  REFILLS HAVE BEEN SENT IN FOR NAPROSYN, TRAMADOL,   Labwork: NONE ORDERED   Testing/Procedures: 1. Your physician has requested that you have an echocardiogram. Echocardiography is a painless test that uses sound waves to create images of your heart. It provides your doctor with information about the size and shape of your heart and how well your heart's chambers and valves are working. This procedure takes approximately one hour. There are no restrictions for this procedure.    Follow-Up: Your physician recommends that you schedule a follow-up appointment in: DR. Eden EmmsNISHAN 2 MONTHS  Any Other Special Instructions Will Be Listed Below (If Applicable). Echocardiogram An echocardiogram, or echocardiography, uses sound waves (ultrasound) to produce an image of your heart. The echocardiogram is simple, painless, obtained within a short period of time, and offers valuable information to your health care provider. The images from an echocardiogram can provide information such as:  Evidence of coronary artery disease (CAD).  Heart size.  Heart muscle function.  Heart valve function.  Aneurysm detection.  Evidence of a past heart attack.  Fluid buildup around the heart.  Heart muscle thickening.  Assess heart valve function.  Tell a health care provider about:  Any allergies you have.  All medicines you are taking, including vitamins, herbs, eye drops, creams, and over-the-counter medicines.  Any problems you or family members have had with anesthetic medicines.  Any blood disorders you have.  Any surgeries you have had.  Any medical conditions you have.  Whether you are pregnant or may be pregnant. What happens before the procedure? No special preparation is needed. Eat and drink normally. What  happens during the procedure?  In order to produce an image of your heart, gel will be applied to your chest and a wand-like tool (transducer) will be moved over your chest. The gel will help transmit the sound waves from the transducer. The sound waves will harmlessly bounce off your heart to allow the heart images to be captured in real-time motion. These images will then be recorded.  You may need an IV to receive a medicine that improves the quality of the pictures. What happens after the procedure? You may return to your normal schedule including diet, activities, and medicines, unless your health care provider tells you otherwise. This information is not intended to replace advice given to you by your health care provider. Make sure you discuss any questions you have with your health care provider. Document Released: 12/06/2000 Document Revised: 07/27/2016 Document Reviewed: 08/16/2013 Elsevier Interactive Patient Education  2017 [employer]    If you need a refill on your cardiac medications before your next appointment, please call your pharmacy.

## 2018-01-16 ENCOUNTER — Ambulatory Visit (HOSPITAL_COMMUNITY): Payer: BLUE CROSS/BLUE SHIELD | Attending: Internal Medicine

## 2018-01-16 ENCOUNTER — Other Ambulatory Visit: Payer: Self-pay

## 2018-01-16 DIAGNOSIS — R002 Palpitations: Secondary | ICD-10-CM | POA: Diagnosis not present

## 2018-01-16 DIAGNOSIS — R55 Syncope and collapse: Secondary | ICD-10-CM | POA: Diagnosis not present

## 2018-01-16 DIAGNOSIS — Z720 Tobacco use: Secondary | ICD-10-CM | POA: Insufficient documentation

## 2018-03-09 ENCOUNTER — Other Ambulatory Visit: Payer: Self-pay

## 2018-03-09 ENCOUNTER — Encounter (HOSPITAL_COMMUNITY): Payer: Self-pay

## 2018-03-09 ENCOUNTER — Ambulatory Visit: Payer: BLUE CROSS/BLUE SHIELD | Admitting: Cardiovascular Disease

## 2018-03-09 ENCOUNTER — Emergency Department (HOSPITAL_COMMUNITY)
Admission: EM | Admit: 2018-03-09 | Discharge: 2018-03-09 | Disposition: A | Discharge status: Home / Self Care | Payer: BLUE CROSS/BLUE SHIELD | Attending: Emergency Medicine | Admitting: Emergency Medicine

## 2018-03-09 DIAGNOSIS — M79605 Pain in left leg: Secondary | ICD-10-CM | POA: Insufficient documentation

## 2018-03-09 DIAGNOSIS — G8929 Other chronic pain: Secondary | ICD-10-CM | POA: Insufficient documentation

## 2018-03-09 DIAGNOSIS — M79604 Pain in right leg: Secondary | ICD-10-CM | POA: Diagnosis present

## 2018-03-09 DIAGNOSIS — F17210 Nicotine dependence, cigarettes, uncomplicated: Secondary | ICD-10-CM | POA: Insufficient documentation

## 2018-03-09 MED ORDER — CYCLOBENZAPRINE HCL 10 MG PO TABS: 10.0000 mg | ORAL_TABLET | Freq: Two times a day (BID) | ORAL | 0 refills | Status: DC | PRN

## 2018-03-09 MED ORDER — KETOROLAC TROMETHAMINE 30 MG/ML IJ SOLN
60.0000 mg | Freq: Once | INTRAMUSCULAR | Status: AC
  Administered 2018-03-09: 60 mg via INTRAMUSCULAR
  Filled 2018-03-09: qty 2

## 2018-03-09 NOTE — ED Notes (Signed)
Pt verbalizes understanding of d/c instructions. Pt received prescriptions. Pt ambulatory at d/c with all belongings.  

## 2018-03-09 NOTE — Discharge Instructions (Signed)
Take Flexeril twice daily as needed for muscle pain and spasms. Do not drive or operate machinery while taking this medication. Use heat and ice 3-4 times daily alternating 20 min on, 20 min off. Please follow up and establish care with a primary care provider by calling the number circled on your discharge paperwork.
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
## 2018-04-14 ENCOUNTER — Ambulatory Visit (HOSPITAL_COMMUNITY)
Admission: EM | Admit: 2018-04-14 | Discharge: 2018-04-14 | Disposition: A | Discharge status: Home / Self Care | Payer: BLUE CROSS/BLUE SHIELD | Attending: Family Medicine | Admitting: Family Medicine

## 2018-04-14 ENCOUNTER — Encounter (HOSPITAL_COMMUNITY): Payer: Self-pay | Admitting: Emergency Medicine

## 2018-04-14 DIAGNOSIS — R197 Diarrhea, unspecified: Secondary | ICD-10-CM

## 2018-04-14 DIAGNOSIS — R112 Nausea with vomiting, unspecified: Secondary | ICD-10-CM | POA: Diagnosis not present

## 2018-04-14 LAB — GLUCOSE, CAPILLARY: Glucose-Capillary: 134 mg/dL — ABNORMAL HIGH (ref 65–99)

## 2018-04-14 MED ORDER — ONDANSETRON 4 MG PO TBDP: 4.0000 mg | ORAL_TABLET | Freq: Three times a day (TID) | ORAL | 0 refills | Status: DC | PRN

## 2018-04-14 NOTE — ED Provider Notes (Signed)
[facility]    CSN: [id number] Arrival date & time: 04/14/18  1826     History   Chief Complaint Chief Complaint  Patient presents with  . Emesis  . Diarrhea    HPI Alexander Mccarthy is a 37 y.o. male.   37 year old male comes in for 3-day history of nausea, vomiting, diarrhea.  Has had 6 episodes of nonbilious nonbloody vomit.  3 episodes of loose/watery stools.  Denies hematochezia, melena.  He has also had body aches, cough, rhinorrhea.  Subjective fever with chills.  Has had abdominal pain when vomiting.  OTC cold medication without relief.  Denies recent travels, recent antibiotic use.  States had a coworker with similar symptoms.  Patient with history of hyperglycemia, no formal diagnosis of diabetes.  Does not check his blood sugars regularly.     Past Medical History:  Diagnosis Date  . Elevated blood pressure reading without diagnosis of hypertension 05/01/2017  . Hyperglycemia 05/01/2017  . Lower extremity pain 05/01/2017  . Palpitations 05/01/2017    Patient Active Problem List   Diagnosis Date Noted  . Lower extremity pain 05/01/2017  . Palpitations 05/01/2017  . Elevated blood pressure reading without diagnosis of hypertension 05/01/2017  . Hyperglycemia 05/01/2017    Past Surgical History:  Procedure Laterality Date  . FEMUR FRACTURE SURGERY    . LEG SURGERY         Home Medications    Prior to Admission medications   Medication Sig Start Date End Date Taking? Authorizing Provider  cyclobenzaprine (FLEXERIL) 10 MG tablet Take 1 tablet (10 mg total) by mouth 2 (two) times daily as needed for muscle spasms. 03/09/18   Law, Waylan Boga, PA-C  naproxen (NAPROSYN) 500 MG tablet Take 1 tablet (500 mg total) by mouth 2 (two) times daily with a meal. X 10 days then prn pain 01/09/18   Leone Brand, NP  ondansetron (ZOFRAN ODT) 4 MG disintegrating tablet Take 1 tablet (4 mg total) by mouth every 8 (eight) hours as needed for nausea or vomiting.  04/14/18   Cathie Hoops, Bethsaida Siegenthaler V, PA-C  traMADol (ULTRAM) 50 MG tablet Take 1 tablet (50 mg total) by mouth every 8 (eight) hours as needed. 01/09/18   Leone Brand, NP    Family History Family History  Problem Relation Age of Onset  . Hypertension Mother   . Asthma Other     Social History Social History   Tobacco Use  . Smoking status: Current Every Day Smoker    Packs/day: 0.50    Years: 10.00    Pack years: 5.00    Types: Cigarettes  . Smokeless tobacco: Never Used  Substance Use Topics  . Alcohol use: Yes  . Drug use: No     Allergies   Patient has no known allergies.   Review of Systems Review of Systems  Reason unable to perform ROS: See HPI as above.     Physical Exam Triage Vital Signs ED Triage Vitals [04/14/18 1907]  Enc Vitals Group     BP 117/84     Pulse Rate 72     Resp 18     Temp 98 F (36.7 C)     Temp Source Oral     SpO2 95 %     Weight      Height      Head Circumference      Peak Flow      Pain Score      Pain Loc  Pain Edu?      Excl. in GC?    No data found.  Updated Vital Signs BP 117/84 (BP Location: Left Arm)   Pulse 72   Temp 98 F (36.7 C) (Oral)   Resp 18   SpO2 95%    Physical Exam  Constitutional: He is oriented to person, place, and time. He appears well-developed and well-nourished. No distress.  HENT:  Head: Normocephalic and atraumatic.  Right Ear: Tympanic membrane, external ear and ear canal normal. Tympanic membrane is not erythematous and not bulging.  Left Ear: Tympanic membrane, external ear and ear canal normal. Tympanic membrane is not erythematous and not bulging.  Nose: Nose normal. Right sinus exhibits no maxillary sinus tenderness and no frontal sinus tenderness. Left sinus exhibits no maxillary sinus tenderness and no frontal sinus tenderness.  Mouth/Throat: Uvula is midline, oropharynx is clear and moist and mucous membranes are normal.  Eyes: Pupils are equal, round, and reactive to light.  Conjunctivae are normal.  Neck: Normal range of motion. Neck supple.  Cardiovascular: Normal rate, regular rhythm and normal heart sounds. Exam reveals no gallop and no friction rub.  No murmur heard. Pulmonary/Chest: Effort normal and breath sounds normal. He has no decreased breath sounds. He has no wheezes. He has no rhonchi. He has no rales.  Abdominal: Soft. Bowel sounds are normal. He exhibits no mass. There is no tenderness. There is no rebound and no guarding.  Lymphadenopathy:    He has no cervical adenopathy.  Neurological: He is alert and oriented to person, place, and time.  Skin: Skin is warm and dry.  Psychiatric: He has a normal mood and affect. His behavior is normal. Judgment normal.     UC Treatments / Results  Labs (all labs ordered are listed, but only abnormal results are displayed) Labs Reviewed  GLUCOSE, CAPILLARY - Abnormal; Notable for the following components:      Result Value   Glucose-Capillary 134 (*)    All other components within normal limits    EKG None Radiology No results found.  Procedures Procedures (including critical care time)  Medications Ordered in UC Medications - No data to display   Initial Impression / Assessment and Plan / UC Course  I have reviewed the triage vital signs and the nursing notes.  Pertinent labs & imaging results that were available during my care of the patient were reviewed by me and considered in my medical decision making (see chart for details).    Discussed with patient no alarming signs on exam. Zofran for nausea. Push fluids. Bland diet, advance as tolerated. Return precautions given.  Final Clinical Impressions(s) / UC Diagnoses   Final diagnoses:  Nausea vomiting and diarrhea    ED Discharge Orders        Ordered    ondansetron (ZOFRAN ODT) 4 MG disintegrating tablet  Every 8 hours PRN     04/14/18 1959        Belinda FisherYu, Nana Vastine V, PA-C 04/14/18 2043

## 2018-04-14 NOTE — Discharge Instructions (Signed)
Zofran for nausea and vomiting as needed. Keep hydrated, you urine should be clear to pale yellow in color. Bland diet as attached, advance as tolerated. Monitor for any worsening of symptoms, nausea or vomiting not controlled by medication, worsening abdominal pain, fever, weakness, dizziness, go to the emergency department for further evaluation needed.

## 2018-04-14 NOTE — ED Triage Notes (Signed)
Pt sts N/V/D and chills x 3 days

## 2018-09-01 ENCOUNTER — Encounter (HOSPITAL_COMMUNITY): Payer: Self-pay | Admitting: Emergency Medicine

## 2018-09-01 ENCOUNTER — Ambulatory Visit (HOSPITAL_COMMUNITY)
Admission: EM | Admit: 2018-09-01 | Discharge: 2018-09-01 | Disposition: A | Discharge status: Home / Self Care | Payer: BLUE CROSS/BLUE SHIELD | Attending: Family Medicine | Admitting: Family Medicine

## 2018-09-01 DIAGNOSIS — M62838 Other muscle spasm: Secondary | ICD-10-CM

## 2018-09-01 DIAGNOSIS — M79651 Pain in right thigh: Secondary | ICD-10-CM | POA: Diagnosis not present

## 2018-09-01 DIAGNOSIS — M79652 Pain in left thigh: Secondary | ICD-10-CM | POA: Diagnosis not present

## 2018-09-01 MED ORDER — KETOROLAC TROMETHAMINE 60 MG/2ML IM SOLN
INTRAMUSCULAR | Status: AC
  Filled 2018-09-01: qty 2

## 2018-09-01 MED ORDER — CYCLOBENZAPRINE HCL 10 MG PO TABS: 10.0000 mg | ORAL_TABLET | Freq: Two times a day (BID) | ORAL | 0 refills | Status: DC | PRN

## 2018-09-01 MED ORDER — KETOROLAC TROMETHAMINE 60 MG/2ML IM SOLN
60.0000 mg | Freq: Once | INTRAMUSCULAR | Status: AC
  Administered 2018-09-01: 60 mg via INTRAMUSCULAR

## 2018-09-01 MED ORDER — NAPROXEN 500 MG PO TABS: 500.0000 mg | ORAL_TABLET | Freq: Two times a day (BID) | ORAL | 0 refills | Status: DC

## 2018-09-01 NOTE — ED Provider Notes (Signed)
[facility]    CSN: [id number] Arrival date & time: 09/01/18  4098     History   Chief Complaint Chief Complaint  Patient presents with  . Leg Pain    HPI Alexander Mccarthy is a 37 y.o. male.   Pt reports these episodes have been occurring since a rod was placed in his femur.    Leg Pain  Location:  Leg (bilateral upper thighs. ) Leg location:  L upper leg and R upper leg Pain details:    Quality:  Aching (spasms)   Radiates to:  Does not radiate   Severity:  Moderate   Onset quality:  Gradual   Duration:  6 days   Timing:  Sporadic   Progression:  Waxing and waning Chronicity:  Recurrent Dislocation: no   Foreign body present:  No foreign bodies Prior injury to area:  Yes Worsened by:  Activity Ineffective treatments:  None tried Associated symptoms: decreased ROM and stiffness   Associated symptoms: no back pain, no fatigue, no fever, no itching, no muscle weakness, no neck pain, no numbness, no swelling and no tingling   Risk factors: no obesity and no recent illness     Past Medical History:  Diagnosis Date  . Elevated blood pressure reading without diagnosis of hypertension 05/01/2017  . Hyperglycemia 05/01/2017  . Lower extremity pain 05/01/2017  . Palpitations 05/01/2017    Patient Active Problem List   Diagnosis Date Noted  . Lower extremity pain 05/01/2017  . Palpitations 05/01/2017  . Elevated blood pressure reading without diagnosis of hypertension 05/01/2017  . Hyperglycemia 05/01/2017    Past Surgical History:  Procedure Laterality Date  . FEMUR FRACTURE SURGERY    . LEG SURGERY         Home Medications    Prior to Admission medications   Medication Sig Start Date End Date Taking? Authorizing Provider  cyclobenzaprine (FLEXERIL) 10 MG tablet Take 1 tablet (10 mg total) by mouth 2 (two) times daily as needed for muscle spasms. 09/01/18   Shylin Keizer, Gloris Manchester A, NP  naproxen (NAPROSYN) 500 MG tablet Take 1 tablet (500 mg total) by  mouth 2 (two) times daily. 09/01/18   Dahlia Byes A, NP  ondansetron (ZOFRAN ODT) 4 MG disintegrating tablet Take 1 tablet (4 mg total) by mouth every 8 (eight) hours as needed for nausea or vomiting. 04/14/18   Cathie Hoops, Amy V, PA-C  traMADol (ULTRAM) 50 MG tablet Take 1 tablet (50 mg total) by mouth every 8 (eight) hours as needed. Patient not taking: Reported on 09/01/2018 01/09/18   Leone Brand, NP    Family History Family History  Problem Relation Age of Onset  . Hypertension Mother   . Asthma Other     Social History Social History   Tobacco Use  . Smoking status: Current Every Day Smoker    Packs/day: 0.50    Years: 10.00    Pack years: 5.00    Types: Cigarettes  . Smokeless tobacco: Never Used  Substance Use Topics  . Alcohol use: Yes  . Drug use: No     Allergies   Patient has no known allergies.   Review of Systems Review of Systems  Constitutional: Negative for fatigue and fever.  Musculoskeletal: Positive for stiffness. Negative for back pain and neck pain.  Skin: Negative for itching.     Physical Exam Triage Vital Signs ED Triage Vitals  Enc Vitals Group     BP 09/01/18 0853 (!) 140/93  Pulse Rate 09/01/18 0853 92     Resp 09/01/18 0853 18     Temp 09/01/18 0853 99 F (37.2 C)     Temp Source 09/01/18 0853 Oral     SpO2 09/01/18 0853 98 %     Weight --      Height --      Head Circumference --      Peak Flow --      Pain Score 09/01/18 0917 8     Pain Loc --      Pain Edu? --      Excl. in GC? --    No data found.  Updated Vital Signs BP (!) 140/93 (BP Location: Left Arm)   Pulse 92   Temp 99 F (37.2 C) (Oral)   Resp 18   SpO2 98%   Visual Acuity Right Eye Distance:   Left Eye Distance:   Bilateral Distance:    Right Eye Near:   Left Eye Near:    Bilateral Near:     Physical Exam  Constitutional: He is oriented to person, place, and time. He appears well-developed and well-nourished.  Very pleasant. Non toxic or ill  appearing.     HENT:  Head: Normocephalic and atraumatic.  Eyes: Conjunctivae are normal.  Neck: Normal range of motion.  Pulmonary/Chest: Effort normal.  Musculoskeletal: Normal range of motion.  Good ROM in bilateral LE. No obvious bruising, swelling, erythema. Good sensation.   Neurological: He is alert and oriented to person, place, and time.  Skin: Skin is warm and dry.  Psychiatric: He has a normal mood and affect.  Nursing note and vitals reviewed.    UC Treatments / Results  Labs (all labs ordered are listed, but only abnormal results are displayed) Labs Reviewed - No data to display  EKG None  Radiology No results found.  Procedures Procedures (including critical care time)  Medications Ordered in UC Medications  ketorolac (TORADOL) injection 60 mg (60 mg Intramuscular Given 09/01/18 0915)    Initial Impression / Assessment and Plan / UC Course  I have reviewed the triage vital signs and the nursing notes.  Pertinent labs & imaging results that were available during my care of the patient were reviewed by me and considered in my medical decision making (see chart for details).     Will go ahead and treat pt muscle spasms with flexeril. Naproxen for pain and inflammation Toradol injection in the clinic.  Follow up as needed for continued or worsening symptoms  Final Clinical Impressions(s) / UC Diagnoses   Final diagnoses:  Muscle spasm     Discharge Instructions     It was nice meeting you!!  We treated you with a Toradol injection in clinic for pain Sending you home with some muscle relaxant and anti inflammatory pain medication.  Follow up as needed for continued or worsening symptoms     ED Prescriptions    Medication Sig Dispense Auth. Provider   cyclobenzaprine (FLEXERIL) 10 MG tablet Take 1 tablet (10 mg total) by mouth 2 (two) times daily as needed for muscle spasms. 20 tablet Hobie Kohles A, NP   naproxen (NAPROSYN) 500 MG tablet Take  1 tablet (500 mg total) by mouth 2 (two) times daily. 30 tablet Dahlia Byes A, NP     Controlled Substance Prescriptions [place] Controlled Substance Registry consulted? Not Applicable   Janace Aris, NP 09/01/18 1003

## 2018-09-01 NOTE — Discharge Instructions (Addendum)
It was nice meeting you!!  We treated you with a Toradol injection in clinic for pain Sending you home with some muscle relaxant and anti inflammatory pain medication.  Follow up as needed for continued or worsening symptoms
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
## 2019-07-24 IMAGING — CR DG CHEST 2V
2 series · 2 of 2 positions shown · non-contrast
Comparison: Chest radiograph performed 04/15/2017

CLINICAL DATA: Acute onset of generalized chest tightness. Syncope.

EXAM:
CHEST  2 VIEW

[chest lat]
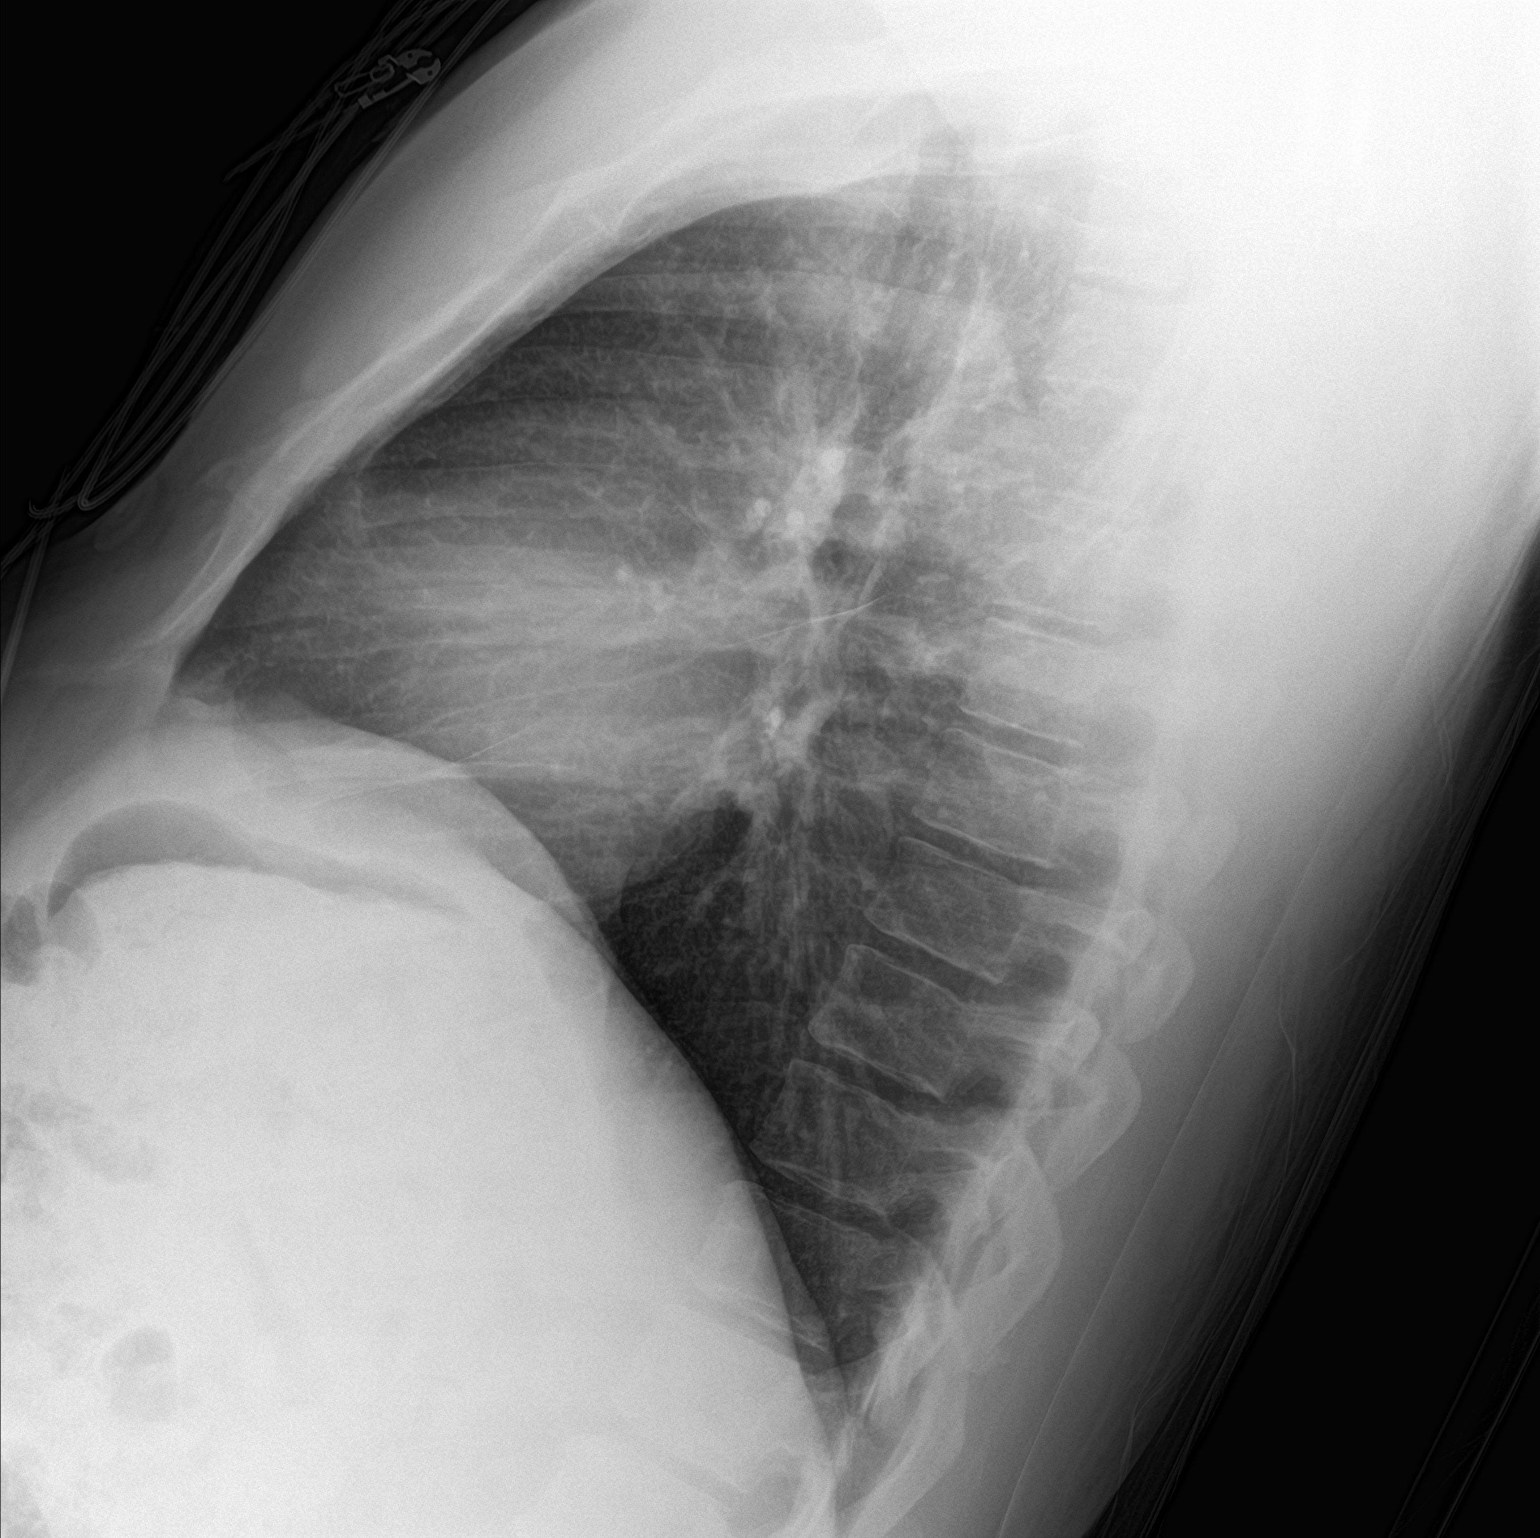

[chest ap]
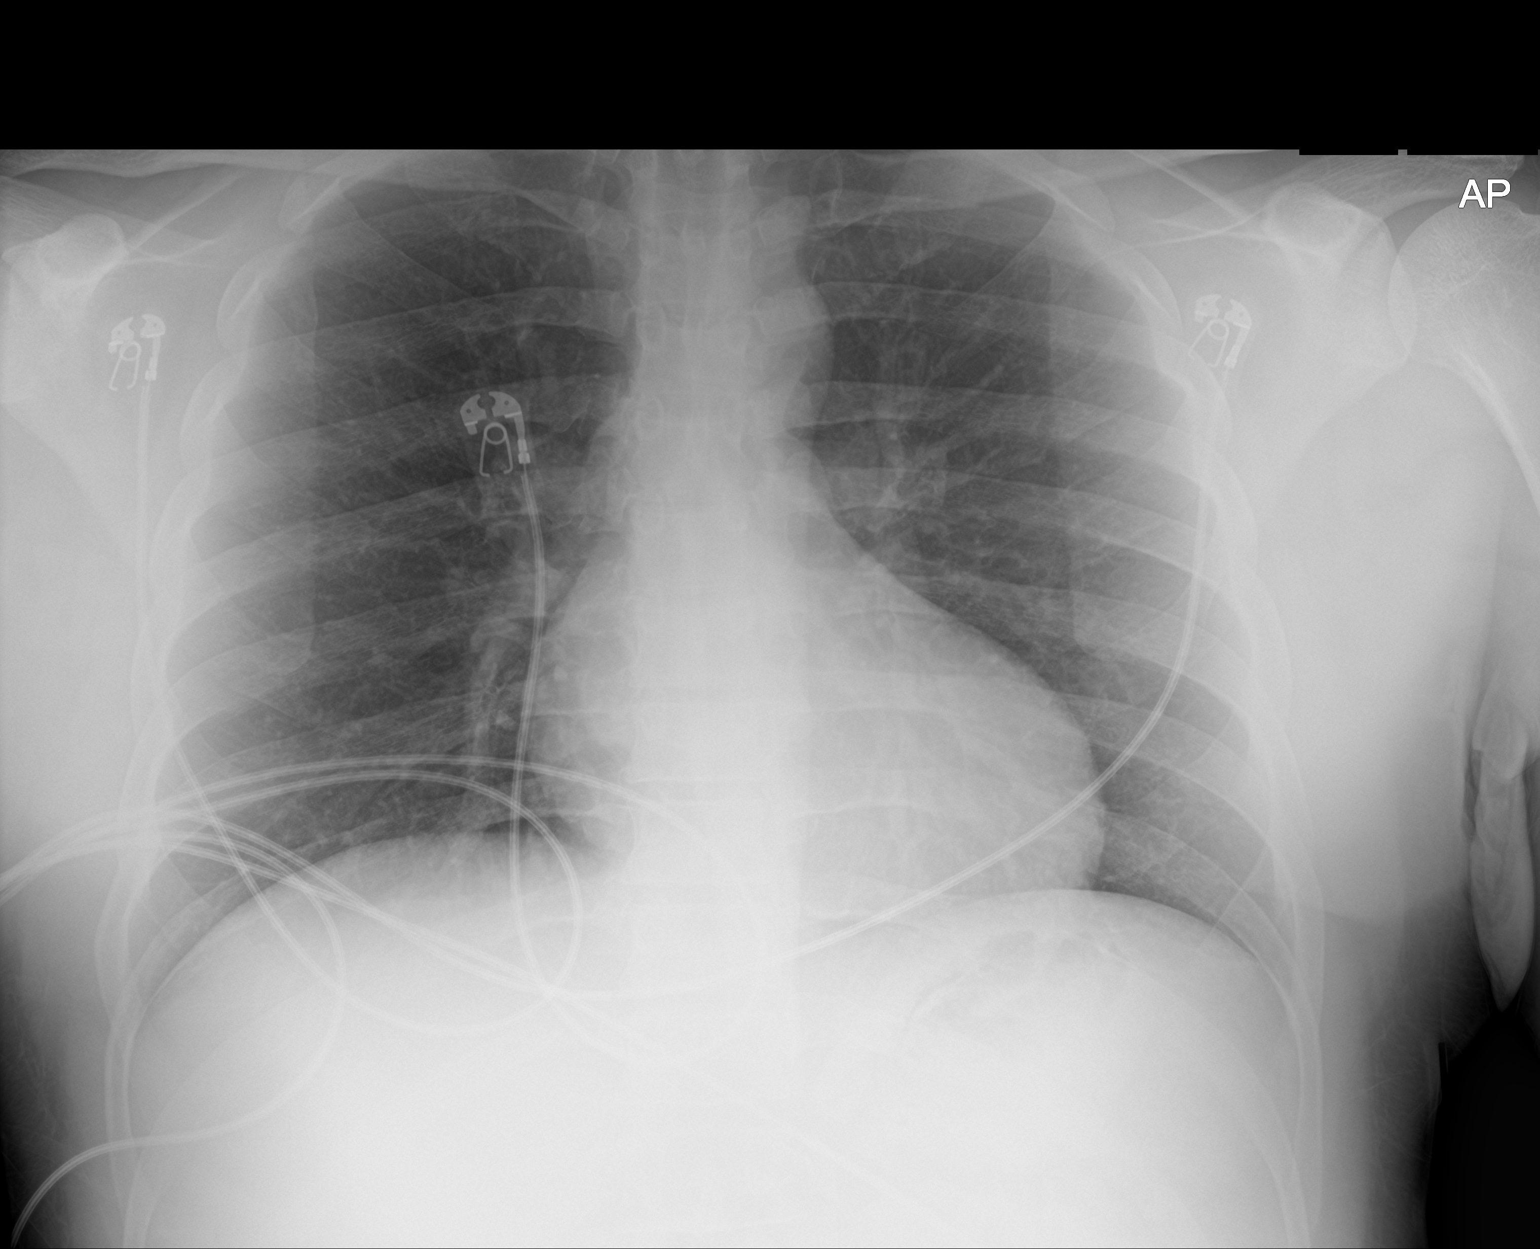

[2 of 2 positions shown; findings below may reference images not displayed]

FINDINGS: The lungs are well-aerated and clear. There is no evidence of focal
opacification, pleural effusion or pneumothorax.

The heart is normal in size; the mediastinal contour is within
normal limits. No acute osseous abnormalities are seen.
IMPRESSION: No acute cardiopulmonary process seen.

## 2019-11-13 ENCOUNTER — Encounter (HOSPITAL_COMMUNITY): Payer: Self-pay

## 2019-11-13 ENCOUNTER — Other Ambulatory Visit: Payer: Self-pay

## 2019-11-13 ENCOUNTER — Ambulatory Visit (HOSPITAL_COMMUNITY)
Admission: EM | Admit: 2019-11-13 | Discharge: 2019-11-13 | Disposition: A | Discharge status: Home / Self Care | Payer: BLUE CROSS/BLUE SHIELD | Attending: Family Medicine | Admitting: Family Medicine

## 2019-11-13 DIAGNOSIS — M79605 Pain in left leg: Secondary | ICD-10-CM

## 2019-11-13 MED ORDER — CYCLOBENZAPRINE HCL 10 MG PO TABS: 10.0000 mg | ORAL_TABLET | Freq: Two times a day (BID) | ORAL | 0 refills | Status: DC | PRN

## 2019-11-13 MED ORDER — METHYLPREDNISOLONE ACETATE 40 MG/ML IJ SUSP
INTRAMUSCULAR | Status: AC
  Filled 2019-11-13: qty 1

## 2019-11-13 MED ORDER — METHYLPREDNISOLONE ACETATE 40 MG/ML IJ SUSP
40.0000 mg | Freq: Once | INTRAMUSCULAR | Status: AC
  Administered 2019-11-13: 16:00:00 40 mg via INTRAMUSCULAR

## 2019-11-13 NOTE — Discharge Instructions (Signed)
Please try heat on the area  Please follow up if your symptoms fail to improve.
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
## 2019-11-13 NOTE — ED Triage Notes (Signed)
Pt present left leg pain, symptoms started  Two days ago. Pt also states he is having muscle spasm in his left leg.
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
## 2020-10-08 ENCOUNTER — Other Ambulatory Visit: Payer: Self-pay

## 2020-10-08 ENCOUNTER — Encounter (HOSPITAL_COMMUNITY): Payer: Self-pay | Admitting: Emergency Medicine

## 2020-10-08 ENCOUNTER — Emergency Department (HOSPITAL_COMMUNITY)
Admission: EM | Admit: 2020-10-08 | Discharge: 2020-10-09 | Disposition: A | Discharge status: Home / Self Care | Payer: Self-pay | Attending: Emergency Medicine | Admitting: Emergency Medicine

## 2020-10-08 DIAGNOSIS — M62831 Muscle spasm of calf: Secondary | ICD-10-CM | POA: Insufficient documentation

## 2020-10-08 DIAGNOSIS — M79652 Pain in left thigh: Secondary | ICD-10-CM | POA: Insufficient documentation

## 2020-10-08 DIAGNOSIS — M25562 Pain in left knee: Secondary | ICD-10-CM | POA: Insufficient documentation

## 2020-10-08 DIAGNOSIS — Z5321 Procedure and treatment not carried out due to patient leaving prior to being seen by health care provider: Secondary | ICD-10-CM | POA: Insufficient documentation

## 2020-10-08 NOTE — ED Triage Notes (Signed)
C/o L knee pain and "spasms" to L thigh x 5 days.  States the last 2 days have been worse and he hasn't been able to work.  States he has a rod in L leg.  Denies recent injury.

## 2020-10-09 ENCOUNTER — Ambulatory Visit (HOSPITAL_COMMUNITY)
Admission: EM | Admit: 2020-10-09 | Discharge: 2020-10-09 | Disposition: A | Discharge status: Home / Self Care | Payer: Self-pay | Attending: Family Medicine | Admitting: Family Medicine

## 2020-10-09 ENCOUNTER — Other Ambulatory Visit: Payer: Self-pay

## 2020-10-09 ENCOUNTER — Encounter (HOSPITAL_COMMUNITY): Payer: Self-pay

## 2020-10-09 DIAGNOSIS — M79605 Pain in left leg: Secondary | ICD-10-CM

## 2020-10-09 DIAGNOSIS — M25562 Pain in left knee: Secondary | ICD-10-CM

## 2020-10-09 MED ORDER — CYCLOBENZAPRINE HCL 10 MG PO TABS: 10.0000 mg | ORAL_TABLET | Freq: Two times a day (BID) | ORAL | 0 refills | Status: DC | PRN

## 2020-10-09 MED ORDER — PREDNISONE 10 MG PO TABS: ORAL_TABLET | ORAL | 0 refills | Status: DC

## 2020-10-09 NOTE — ED Triage Notes (Signed)
Pt presents with left-sided lower back pain, radiates to left leg and left knee for the past 5 days. States he has a rod in the left leg.
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
## 2020-10-13 ENCOUNTER — Ambulatory Visit: Payer: Self-pay | Admitting: Cardiovascular Disease

## 2021-05-10 ENCOUNTER — Ambulatory Visit (HOSPITAL_COMMUNITY)
Admission: EM | Admit: 2021-05-10 | Discharge: 2021-05-10 | Disposition: A | Discharge status: Home / Self Care | Payer: Self-pay | Attending: Internal Medicine | Admitting: Internal Medicine

## 2021-05-10 ENCOUNTER — Other Ambulatory Visit: Payer: Self-pay

## 2021-05-10 ENCOUNTER — Encounter (HOSPITAL_COMMUNITY): Payer: Self-pay

## 2021-05-10 DIAGNOSIS — R197 Diarrhea, unspecified: Secondary | ICD-10-CM

## 2021-05-10 DIAGNOSIS — Z1152 Encounter for screening for COVID-19: Secondary | ICD-10-CM

## 2021-05-10 NOTE — Discharge Instructions (Addendum)
You can try Immodium (Loperamide is generic) over the counter for diarrhea.  BRAT (bananas, rice, applesauce, toast) diet and advance as tolerated.  Drink plenty of fluids like Pedialyte for electrolyte replacement.  I am screening you today for Covid-19.  Please activate your MyChart as this will be the fastest way to get your results.  You will need to isolate until COVID testing results received.  If positive will need to continue isolation for 5 days from time of symptom onset.  Return or go to emergency department for any worsening or persistent symptoms.

## 2021-05-10 NOTE — ED Triage Notes (Signed)
Pt reports abdominal pain, diarrhea and nausea x 3 days. States he needs to use the restroom every 30 min. Pepto Bismol gives no relief.

## 2021-05-10 NOTE — ED Provider Notes (Signed)
[facility]    CSN: [id number] Arrival date & time: 05/10/21  1513      History   Chief Complaint Chief Complaint  Patient presents with  . Diarrhea  . Nausea  . Abdominal Pain    HPI Alexander Mccarthy is a 40 y.o. male  with history of hypertension presents to urgent care today with complaints of diarrhea.  Patient reports a few episodes of loose stool over the past 2 days with increase in intensity since 0400 this morning.  Patient describes watery stool, he denies any hematochezia.  Describes abdominal cramping but denies any abdominal pain, no recent fever, headache, dizziness, chest pain, SOB, n/v.  Able to tolerate p.o.'s without any vomiting.  Denies recent travel.  Past Medical History:  Diagnosis Date  . Elevated blood pressure reading without diagnosis of hypertension 05/01/2017  . Hyperglycemia 05/01/2017  . Lower extremity pain 05/01/2017  . Palpitations 05/01/2017    Patient Active Problem List   Diagnosis Date Noted  . Lower extremity pain 05/01/2017  . Palpitations 05/01/2017  . Elevated blood pressure reading without diagnosis of hypertension 05/01/2017  . Hyperglycemia 05/01/2017    Past Surgical History:  Procedure Laterality Date  . FEMUR FRACTURE SURGERY    . LEG SURGERY         Home Medications    Prior to Admission medications   Medication Sig Start Date End Date Taking? Authorizing Provider  cyclobenzaprine (FLEXERIL) 10 MG tablet Take 1 tablet (10 mg total) by mouth 2 (two) times daily as needed for muscle spasms. DO NOT DRINK ALCOHOL OR DRIVE WHILE TAKING THESE MEDICATIONS 10/09/20   Particia Nearing, PA-C  naproxen (NAPROSYN) 500 MG tablet Take 1 tablet (500 mg total) by mouth 2 (two) times daily. 09/01/18   Dahlia Byes A, NP  ondansetron (ZOFRAN ODT) 4 MG disintegrating tablet Take 1 tablet (4 mg total) by mouth every 8 (eight) hours as needed for nausea or vomiting. 04/14/18   Cathie Hoops, Amy V, PA-C  predniSONE (DELTASONE) 10 MG  tablet Take 6 tabs day one, 5 tabs day two, 4 tabs day three, etc 10/09/20   Particia Nearing, PA-C  traMADol (ULTRAM) 50 MG tablet Take 1 tablet (50 mg total) by mouth every 8 (eight) hours as needed. Patient not taking: No sig reported 01/09/18   Leone Brand, NP    Family History Family History  Problem Relation Age of Onset  . Hypertension Mother   . Asthma Other     Social History Social History   Tobacco Use  . Smoking status: Current Every Day Smoker    Packs/day: 0.50    Years: 10.00    Pack years: 5.00    Types: Cigarettes  . Smokeless tobacco: Never Used  Substance Use Topics  . Alcohol use: Yes  . Drug use: No     Allergies   Patient has no known allergies.   Review of Systems    Physical Exam Triage Vital Signs ED Triage Vitals  Enc Vitals Group     BP 05/10/21 1627 131/80     Pulse Rate 05/10/21 1627 (!) 102     Resp 05/10/21 1627 18     Temp 05/10/21 1627 98.7 F (37.1 C)     Temp Source 05/10/21 1627 Oral     SpO2 05/10/21 1627 97 %     Weight --      Height --      Head Circumference --  Peak Flow --      Pain Score 05/10/21 1626 4     Pain Loc --      Pain Edu? --      Excl. in [place]? --    No data found.  Updated Vital Signs BP 131/80 (BP Location: Left Arm)   Pulse (!) 102   Temp 98.7 F (37.1 C) (Oral)   Resp 18   SpO2 97%   Visual Acuity Right Eye Distance:   Left Eye Distance:   Bilateral Distance:    Right Eye Near:   Left Eye Near:    Bilateral Near:     Physical Exam Vitals reviewed.  Constitutional:      General: He is not in acute distress.    Appearance: He is well-developed. He is not ill-appearing or toxic-appearing.  HENT:     Mouth/Throat:     Mouth: Mucous membranes are moist.  Cardiovascular:     Rate and Rhythm: Normal rate and regular rhythm.     Heart sounds: Normal heart sounds. No murmur heard. No friction rub. No gallop.   Pulmonary:     Effort: Pulmonary effort is normal.      Breath sounds: Normal breath sounds.  Abdominal:     General: Bowel sounds are normal.     Palpations: Abdomen is soft.     Tenderness: There is no abdominal tenderness. There is no guarding or rebound.  Skin:    General: Skin is warm and dry.  Neurological:     General: No focal deficit present.     Mental Status: He is alert.  Psychiatric:        Mood and Affect: Mood normal.        Behavior: Behavior normal.      UC Treatments / Results  Labs (all labs ordered are listed, but only abnormal results are displayed) Labs Reviewed  SARS CORONAVIRUS 2 (TAT 6-24 HRS)    EKG   Radiology No results found.  Procedures Procedures (including critical care time)  Medications Ordered in UC Medications - No data to display  Initial Impression / Assessment and Plan / UC Course  I have reviewed the triage vital signs and the nursing notes.  Pertinent labs & imaging results that were available during my care of the patient were reviewed by me and considered in my medical decision making (see chart for details).  Diarrhea -VSS, nontoxic appearing, afebrile without evidence of dehydration on exam -Suspect viral etiology versus food related -Will check COVID PCR.  Isolation precautions discussed -Imodium as needed, brat diet advance as tolerated, push fluids -Strict return precautions given.  Patient verifies understanding  Reviewed expections re: course of current medical issues. Questions answered. Outlined signs and symptoms indicating need for more acute intervention. Pt verbalized understanding. AVS given   Final Clinical Impressions(s) / UC Diagnoses   Final diagnoses:  Diarrhea, unspecified type  Encounter for screening for COVID-19     Discharge Instructions     You can try Immodium (Loperamide is generic) over the counter for diarrhea.  BRAT (bananas, rice, applesauce, toast) diet and advance as tolerated.  Drink plenty of fluids like Pedialyte for electrolyte  replacement.  I am screening you today for Covid-19.  Please activate your MyChart as this will be the fastest way to get your results.  You will need to isolate until COVID testing results received.  If positive will need to continue isolation for 5 days from time of symptom onset.  Return  or go to emergency department for any worsening or persistent symptoms.    ED Prescriptions    None     PDMP not reviewed this encounter.   Rolla Etienne, NP 05/10/21 2220

## 2021-05-11 LAB — SARS CORONAVIRUS 2 (TAT 6-24 HRS): SARS Coronavirus 2: NEGATIVE

## 2022-01-26 ENCOUNTER — Encounter (HOSPITAL_COMMUNITY): Payer: Self-pay | Admitting: Emergency Medicine

## 2022-01-26 ENCOUNTER — Emergency Department (HOSPITAL_COMMUNITY): Payer: Self-pay

## 2022-01-26 ENCOUNTER — Other Ambulatory Visit: Payer: Self-pay

## 2022-01-26 ENCOUNTER — Observation Stay (HOSPITAL_COMMUNITY)
Admission: EM | Admit: 2022-01-26 | Discharge: 2022-01-27 | Disposition: A | Discharge status: Home / Self Care | Payer: Self-pay | Attending: Internal Medicine | Admitting: Internal Medicine

## 2022-01-26 DIAGNOSIS — Z20822 Contact with and (suspected) exposure to covid-19: Secondary | ICD-10-CM | POA: Insufficient documentation

## 2022-01-26 DIAGNOSIS — T40601A Poisoning by unspecified narcotics, accidental (unintentional), initial encounter: Secondary | ICD-10-CM

## 2022-01-26 DIAGNOSIS — T402X1A Poisoning by other opioids, accidental (unintentional), initial encounter: Secondary | ICD-10-CM | POA: Diagnosis present

## 2022-01-26 DIAGNOSIS — J9601 Acute respiratory failure with hypoxia: Secondary | ICD-10-CM

## 2022-01-26 DIAGNOSIS — F17210 Nicotine dependence, cigarettes, uncomplicated: Secondary | ICD-10-CM | POA: Insufficient documentation

## 2022-01-26 DIAGNOSIS — F11929 Opioid use, unspecified with intoxication, unspecified: Principal | ICD-10-CM | POA: Insufficient documentation

## 2022-01-26 DIAGNOSIS — Z79899 Other long term (current) drug therapy: Secondary | ICD-10-CM | POA: Insufficient documentation

## 2022-01-26 LAB — RESP PANEL BY RT-PCR (FLU A&B, COVID) ARPGX2
Influenza A by PCR: NEGATIVE
Influenza B by PCR: NEGATIVE
SARS Coronavirus 2 by RT PCR: NEGATIVE

## 2022-01-26 LAB — URINALYSIS, ROUTINE W REFLEX MICROSCOPIC
Bilirubin Urine: NEGATIVE
Glucose, UA: NEGATIVE mg/dL
Hgb urine dipstick: NEGATIVE
Ketones, ur: NEGATIVE mg/dL
Leukocytes,Ua: NEGATIVE
Nitrite: NEGATIVE
Protein, ur: NEGATIVE mg/dL
Specific Gravity, Urine: >1.03 — ABNORMAL HIGH (ref 1.005–1.030)
pH: 6 (ref 5.0–8.0)

## 2022-01-26 LAB — COMPREHENSIVE METABOLIC PANEL
ALT: 25 U/L (ref 0–44)
AST: 31 U/L (ref 15–41)
Albumin: 3.8 g/dL (ref 3.5–5.0)
Alkaline Phosphatase: 72 U/L (ref 38–126)
Anion gap: 8 (ref 5–15)
BUN: 18 mg/dL (ref 6–20)
CO2: 26 mmol/L (ref 22–32)
Calcium: 8.5 mg/dL — ABNORMAL LOW (ref 8.9–10.3)
Chloride: 101 mmol/L (ref 98–111)
Creatinine, Ser: 1.27 mg/dL — ABNORMAL HIGH (ref 0.61–1.24)
GFR, Estimated: >60 mL/min (ref >60)
Glucose, Bld: 106 mg/dL — ABNORMAL HIGH (ref 70–99)
Potassium: 4.2 mmol/L (ref 3.5–5.1)
Sodium: 135 mmol/L (ref 135–145)
Total Bilirubin: 0.5 mg/dL (ref 0.3–1.2)
Total Protein: 6.8 g/dL (ref 6.5–8.1)

## 2022-01-26 LAB — RAPID URINE DRUG SCREEN, HOSP PERFORMED
Amphetamines: NOT DETECTED
Barbiturates: NOT DETECTED
Benzodiazepines: NOT DETECTED
Cocaine: POSITIVE — AB
Opiates: NOT DETECTED
Tetrahydrocannabinol: NOT DETECTED

## 2022-01-26 LAB — CBC WITH DIFFERENTIAL/PLATELET
Abs Immature Granulocytes: 0.03 10*3/uL (ref 0.00–0.07)
Basophils Absolute: 0 10*3/uL (ref 0.0–0.1)
Basophils Relative: 0 %
Eosinophils Absolute: 0.1 10*3/uL (ref 0.0–0.5)
Eosinophils Relative: 2 %
HCT: 43.1 % (ref 39.0–52.0)
Hemoglobin: 13.6 g/dL (ref 13.0–17.0)
Immature Granulocytes: 0 %
Lymphocytes Relative: 23 %
Lymphs Abs: 2.1 10*3/uL (ref 0.7–4.0)
MCH: 28.4 pg (ref 26.0–34.0)
MCHC: 31.6 g/dL (ref 30.0–36.0)
MCV: 90 fL (ref 80.0–100.0)
Monocytes Absolute: 0.8 10*3/uL (ref 0.1–1.0)
Monocytes Relative: 8 %
Neutro Abs: 6.1 10*3/uL (ref 1.7–7.7)
Neutrophils Relative %: 67 %
Platelets: 278 10*3/uL (ref 150–400)
RBC: 4.79 MIL/uL (ref 4.22–5.81)
RDW: 14.9 % (ref 11.5–15.5)
WBC: 9.2 10*3/uL (ref 4.0–10.5)
nRBC: 0 % (ref 0.0–0.2)

## 2022-01-26 MED ORDER — ACETAMINOPHEN 325 MG PO TABS: 650.0000 mg | ORAL_TABLET | Freq: Four times a day (QID) | ORAL | Status: DC | PRN

## 2022-01-26 MED ORDER — NALOXONE HCL 4 MG/10ML IJ SOLN
0.2500 mg/h | INTRAVENOUS | Status: DC
  Administered 2022-01-26: 0.25 mg/h via INTRAVENOUS
  Filled 2022-01-26: qty 10

## 2022-01-26 MED ORDER — NALOXONE HCL 2 MG/2ML IJ SOSY
1.0000 mg | PREFILLED_SYRINGE | Freq: Once | INTRAMUSCULAR | Status: AC
  Administered 2022-01-26: 1 mg via INTRAVENOUS
  Filled 2022-01-26: qty 2

## 2022-01-26 MED ORDER — SODIUM CHLORIDE 0.9 % IV BOLUS
1000.0000 mL | Freq: Once | INTRAVENOUS | Status: AC
  Administered 2022-01-26: 1000 mL via INTRAVENOUS

## 2022-01-26 MED ORDER — POLYETHYLENE GLYCOL 3350 17 G PO PACK: 17.0000 g | PACK | Freq: Every day | ORAL | Status: DC | PRN

## 2022-01-26 MED ORDER — ACETAMINOPHEN 650 MG RE SUPP: 650.0000 mg | Freq: Four times a day (QID) | RECTAL | Status: DC | PRN

## 2022-01-26 MED ORDER — SODIUM CHLORIDE 0.9% FLUSH
3.0000 mL | Freq: Two times a day (BID) | INTRAVENOUS | Status: DC
  Administered 2022-01-27: 3 mL via INTRAVENOUS

## 2022-01-26 MED ORDER — ENOXAPARIN SODIUM 40 MG/0.4ML IJ SOSY
40.0000 mg | PREFILLED_SYRINGE | INTRAMUSCULAR | Status: DC
  Administered 2022-01-26: 40 mg via SUBCUTANEOUS
  Filled 2022-01-26: qty 0.4

## 2022-01-26 MED ORDER — ONDANSETRON HCL 4 MG PO TABS: 4.0000 mg | ORAL_TABLET | Freq: Four times a day (QID) | ORAL | Status: DC | PRN

## 2022-01-26 MED ORDER — ONDANSETRON HCL 4 MG/2ML IJ SOLN: 4.0000 mg | Freq: Four times a day (QID) | INTRAMUSCULAR | Status: DC | PRN

## 2022-01-26 NOTE — ED Provider Notes (Signed)
Pt signed out by Dr. Renaye Rakers.  He had an  unintentional drug OD.  He was given 2 rounds of IN Narcan with minimal response.  HE was then given 1 mg Narcan IV and woke up.  He was given an additional 1 mg Narcan at 1443 as he became somnolent again.  Pt required an additional IV Narcan 1605 as his O2 sats were dropping when he went to sleep.  He did require 2L oxygen to keep O2 sats in the mid-90s.  Pt is more alert and oxygen was removed and O2 sat was stable for a little while.  However, now that the last Narcan is wearing off, he is becoming somnolent again and O2 sat is dropping to 82%.  He was put back on 2L and a narcan drip was ordered.  Pt has responded well to the narcan.  Pt d/w IMTS for admission.  CRITICAL CARE Performed by: Jacalyn Lefevre   Total critical care time: 30 minutes  Critical care time was exclusive of separately billable procedures and treating other patients.  Critical care was necessary to treat or prevent imminent or life-threatening deterioration.  Critical care was time spent personally by me on the following activities: development of treatment plan with patient and/or surrogate as well as nursing, discussions with consultants, evaluation of patient's response to treatment, examination of patient, obtaining history from patient or surrogate, ordering and performing treatments and interventions, ordering and review of laboratory studies, ordering and review of radiographic studies, pulse oximetry and re-evaluation of patient's condition.    Jacalyn Lefevre, MD 01/26/22 2044

## 2022-01-26 NOTE — ED Notes (Signed)
Admitting doctors at the bedside 

## 2022-01-26 NOTE — ED Triage Notes (Signed)
Patient BIB [facility] from home. EMS was called out for unresponsive patient. Agonal respirations upon EMS arrival. Pt received 2 mg intranasal narcan and 1 mg IV narcan. Pt A&Ox4 upon arrival.

## 2022-01-26 NOTE — Hospital Course (Signed)
Snorting cocaine  Unresponsive => intranasal narcan => IV narcan  Desaturates=>88% On narcan drip   Past Medical History:  Diagnosis Date   Elevated blood pressure reading without diagnosis of hypertension 05/01/2017   Hyperglycemia 05/01/2017   Lower extremity pain 05/01/2017   Palpitations 05/01/2017   Vitamin D deficiency  Hyperglycemia

## 2022-01-26 NOTE — ED Provider Notes (Signed)
[facility] EMERGENCY DEPARTMENT Provider Note   CSN: [id number] Arrival date & time: 01/26/22  1419     History  Chief Complaint  Patient presents with   Drug Overdose    Alexander Mccarthy is a 41 y.o. male presenting from home with concern for drug overdose.  Supplemental history is provided by the paramedics, reports that they were called to the scene by the patient's family as he was unresponsive on the ground.  They administered 2 rounds of intranasal Narcan with minimal response, performed bag mask ventilation for about 15 minutes, with the patient providing a good effort, and subsequently establish IV access and gave 1 mg of IV Narcan.  After the IV Narcan the patient rapidly roused and appeared more awake.  On arrival in the ED the patient says he feels groggy, reports he has chronic pain in his leg, but denies any other active symptoms.  In private, the patient reports to me that he did use cocaine today.  He does not want his family to know about this fact.  He reports he also took a muscle relaxer for his leg pain, about two Flexeril.  He adamantly denies knowingly using heroin, fentanyl, or any other opioids. I reviewed PDMP and he is not prescribed narcotics.  HPI     Home Medications Prior to Admission medications   Medication Sig Start Date End Date Taking? Authorizing Provider  cyclobenzaprine (FLEXERIL) 10 MG tablet Take 1 tablet (10 mg total) by mouth 2 (two) times daily as needed for muscle spasms. DO NOT DRINK ALCOHOL OR DRIVE WHILE TAKING THESE MEDICATIONS 10/09/20   Volney American, PA-C  naproxen (NAPROSYN) 500 MG tablet Take 1 tablet (500 mg total) by mouth 2 (two) times daily. 09/01/18   Loura Halt A, NP  ondansetron (ZOFRAN ODT) 4 MG disintegrating tablet Take 1 tablet (4 mg total) by mouth every 8 (eight) hours as needed for nausea or vomiting. 04/14/18   Tasia Catchings, Amy V, PA-C  predniSONE (DELTASONE) 10 MG tablet Take 6 tabs day one, 5 tabs  day two, 4 tabs day three, etc 10/09/20   Volney American, PA-C  traMADol (ULTRAM) 50 MG tablet Take 1 tablet (50 mg total) by mouth every 8 (eight) hours as needed. Patient not taking: No sig reported 01/09/18   Isaiah Serge, NP      Allergies    Patient has no known allergies.    Review of Systems   Review of Systems  Physical Exam Updated Vital Signs BP 135/84    Pulse 88    Temp 98.2 F (36.8 C) (Oral)    Resp 10    SpO2 95%  Physical Exam Constitutional:      General: He is not in acute distress. HENT:     Head: Normocephalic and atraumatic.  Eyes:     Conjunctiva/sclera: Conjunctivae normal.     Pupils: Pupils are equal, round, and reactive to light.     Comments: Pinpoint pupils bilaterally patient is here with suspected accidental drug overdose.  Per the history no hypoxia.  I have ordered a chest x-ray to evaluate for possible aspiration pneumonia given his prolonged downtime.  He will also need monitor  Cardiovascular:     Rate and Rhythm: Normal rate and regular rhythm.     Comments: 95% on room air Pulmonary:     Effort: Pulmonary effort is normal. No respiratory distress.     Breath sounds: Normal breath sounds.  Skin:  General: Skin is warm and dry.  Neurological:     General: No focal deficit present.     Mental Status: He is alert. Mental status is at baseline.    ED Results / Procedures / Treatments   Labs (all labs ordered are listed, but only abnormal results are displayed) Labs Reviewed - No data to display  EKG None  Radiology No results found.  Procedures Procedures    Medications Ordered in ED Medications - No data to display  ED Course/ Medical Decision Making/ A&P Clinical Course as of 01/26/22 1632  Sat Jan 26, 2022  1441 Patient becoming quite somnolent again, I've requested 1 mg narcan to be given IV [MT]  1455 More awake after IV narcan given.  Xray per my review is unremarkable  [MT]  1507 Pt signed out to Dr  Gilford Raid EDP pending reassessment in 2 hours  [MT]    Clinical Course User Index [MT] Felice Hope, Carola Rhine, MD                           Medical Decision Making Amount and/or Complexity of Data Reviewed Radiology: ordered.  Risk Prescription drug management.   Patient here with suspected accidental drug overdose, likely related to narcotic use.  Per his history I suspect his cocaine may have been laced with an opioid, and explained this to him - such as fentanyl, which can be lethal in even small doses.  Strongly advised him to never use these street drugs again, and explained the risk of overdose or death.  He has requested that his wife and family not be informed about this drug use at this time.  He is HD stable on arrival; xray ordered to review for aspiration PNA.  He will need 2 hour monitoring at a minimum to ensure no rebound narcosis when the narcan wears off.    I do not need an emergent indication for blood tests given this history  ECG on arrival per my interpretation shows NSR, no acute ischemic findings.  I doubt stroke, meningitis, sepsis, ICH, or other life threatening emergency at this time.        Final Clinical Impression(s) / ED Diagnoses Final diagnoses:  None    Rx / DC Orders ED Discharge Orders     None         Mae Cianci, Carola Rhine, MD 01/26/22 [phone]

## 2022-01-26 NOTE — ED Notes (Signed)
Pts 02 sats are low dropping into the high 80s  nasal 02 applied at 3 liters  drops off to sleep until the  pulse ox rings  wife at  the bedside

## 2022-01-26 NOTE — H&P (Signed)
Date: 01/26/2022               Patient Name:  Alexander Mccarthy MRN: [id number]  DOB: 1981-05-26 Age / Sex: 41 y.o., male   PCP: Patient, No Pcp Per (Inactive)         Medical Service: Internal Medicine Teaching Service         Attending Physician: Dr. Dickie La, MD    First Contact: Adron Bene, MD Pager: [phone]  Second Contact: Marolyn Haller, MD Pager: [phone]       After Hours (After 5p/  First Contact Pager: [phone]  weekends / holidays): Second Contact Pager: [phone]   SUBJECTIVE   Chief Complaint: Accidental overdose  History of Present Illness:   Alexander Mccarthy is a 41 y.o. M with a PMH of cocaine use disorder coming in after an accidental overdose at home. He was found sitting down slumped over by his wife after he snorted what he thought was cocaine and she called EMS. He received 2 round of intranasal narcan with minimal response and then was given an additional 1 mg of IV narcan and then he woke up. In the ED he became repeatedly somnolent with dropping oxygen saturations requiring several repeat narcan doses and was then subsequently put on a narcan drip.    Meds:  None  Past Medical History:  Diagnosis Date   Elevated blood pressure reading without diagnosis of hypertension 05/01/2017   Hyperglycemia 05/01/2017   Lower extremity pain 05/01/2017   Palpitations 05/01/2017    Past Surgical History:  Procedure Laterality Date   FEMUR FRACTURE SURGERY     LEG SURGERY      Social:  Lives With: Wife and kids Occupation: [employer] account executive PCP: None Substances: no intentional use of opioids, use of cocaine weekly, alcohol (drinks a fifth of alcohol maybe twice a week), and smokes about 5 cigarettes a day. No IVDU. Denies prior history of overdose  Family History:  HTN and diabetes in his mother Family History  Problem Relation Age of Onset   Hypertension Mother    Asthma Other     Allergies: Allergies as of 01/26/2022   (No Known  Allergies)    Review of Systems: A complete ROS was negative except as per HPI.   OBJECTIVE:   Physical Exam: Blood pressure 133/78, pulse 75, temperature 98.6 F (37 C), temperature source Oral, resp. rate 10, SpO2 95 %.  Constitutional: Obese man resting comfortably in bed, in no acute distress HENT: normocephalic atraumatic, mucous membranes moist Eyes: conjunctiva non-erythematous Cardiovascular: regular rate and rhythm, no m/r/g Pulmonary/Chest: normal work of breathing on room air, lungs clear to auscultation bilaterally Abdominal: soft, non-tender, distended MSK: normal bulk and tone Neurological: alert & oriented x 3, answering questions appropriately Skin: warm and dry Psych: normal affect  Labs: CBC    Component Value Date/Time   WBC 9.2 01/26/2022 2040   RBC 4.79 01/26/2022 2040   HGB 13.6 01/26/2022 2040   HCT 43.1 01/26/2022 2040   PLT 278 01/26/2022 2040   MCV 90.0 01/26/2022 2040   MCH 28.4 01/26/2022 2040   MCHC 31.6 01/26/2022 2040   RDW 14.9 01/26/2022 2040   LYMPHSABS 2.1 01/26/2022 2040   MONOABS 0.8 01/26/2022 2040   EOSABS 0.1 01/26/2022 2040   BASOSABS 0.0 01/26/2022 2040     CMP     Component Value Date/Time   NA 137 12/18/2017 0229   K 3.9 12/18/2017 0229   CL 101 12/18/2017 0229  CO2 26 12/18/2017 0229   GLUCOSE 129 (H) 12/18/2017 0229   BUN 17 12/18/2017 0229   CREATININE 1.12 12/18/2017 0229   CALCIUM 8.5 (L) 12/18/2017 0229   PROT 7.8 08/13/2017 2102   ALBUMIN 4.1 08/13/2017 2102   AST 30 08/13/2017 2102   ALT 18 08/13/2017 2102   ALKPHOS 63 08/13/2017 2102   BILITOT 0.8 08/13/2017 2102   GFRNONAA >60 12/18/2017 0229   GFRAA >60 12/18/2017 0229    Imaging: DG Chest Portable 1 View  Result Date: 01/26/2022 CLINICAL DATA:  Status post overdose.  Evaluate for aspiration. EXAM: PORTABLE CHEST 1 VIEW COMPARISON:  12/18/2017 FINDINGS: The heart size and mediastinal contours are within normal limits. Both lungs are clear. The  visualized skeletal structures are unremarkable. IMPRESSION: No active disease. Electronically Signed   By: Elige Ko M.D.   On: 01/26/2022 14:50    EKG: personally reviewed my interpretation is sinus rhythm, t wave inversions unchanged from prior ekg   ASSESSMENT & PLAN:    Assessment & Plan by Problem: Principal Problem:   Opioid overdose (HCC)   Alexander Mccarthy is a 41 y.o. with pertinent PMH of cocaine use disorder admitted after an accidental overdose.  #Opioid Overdose #Cocaine Use Disorder Patient currently on narcan drip at 0.25/hr. Can likely transition off tonight as the opioids are likely wearing off given his overdose was around 12 noon. Patient is saturating well on room air now and mentating appropriately. CXR unremarkable. Patient denies any current chest pain and EKG has no concerning findings. - continue narcan drip - COWS - CIWA without ativan - Q4 neuro checks - cardiac monitoring  #Concern for OSA Patient describes waking up short of breath and snoring at home. Likely needs outpatient workup for OSA -CTM  #Hx of hyperglycemia; obesity Patient has family history of diabetes and has not seen a doctor in probably 5 years. -f/u A1c  Diet: Normal VTE: Enoxaparin IVF: None,None Code: Full  Prior to Admission Living Arrangement: Home, living with family Anticipated Discharge Location: Home Barriers to Discharge: continued medical workup  Dispo: Admit patient to Observation with expected length of stay less than 2 midnights.  Signed: Ilene Qua, MD Internal Medicine Resident PGY-1 Pager: [phone]  01/26/2022, 9:37 PM

## 2022-01-27 DIAGNOSIS — T402X1A Poisoning by other opioids, accidental (unintentional), initial encounter: Secondary | ICD-10-CM

## 2022-01-27 DIAGNOSIS — F11129 Opioid abuse with intoxication, unspecified: Secondary | ICD-10-CM

## 2022-01-27 LAB — COMPREHENSIVE METABOLIC PANEL
ALT: 22 U/L (ref 0–44)
AST: 27 U/L (ref 15–41)
Albumin: 3.5 g/dL (ref 3.5–5.0)
Alkaline Phosphatase: 70 U/L (ref 38–126)
Anion gap: 8 (ref 5–15)
BUN: 16 mg/dL (ref 6–20)
CO2: 28 mmol/L (ref 22–32)
Calcium: 8.6 mg/dL — ABNORMAL LOW (ref 8.9–10.3)
Chloride: 103 mmol/L (ref 98–111)
Creatinine, Ser: 1.23 mg/dL (ref 0.61–1.24)
GFR, Estimated: >60 mL/min (ref >60)
Glucose, Bld: 76 mg/dL (ref 70–99)
Potassium: 3.9 mmol/L (ref 3.5–5.1)
Sodium: 139 mmol/L (ref 135–145)
Total Bilirubin: 0.6 mg/dL (ref 0.3–1.2)
Total Protein: 6.5 g/dL (ref 6.5–8.1)

## 2022-01-27 LAB — CBC
HCT: 42.4 % (ref 39.0–52.0)
Hemoglobin: 14.1 g/dL (ref 13.0–17.0)
MCH: 29.7 pg (ref 26.0–34.0)
MCHC: 33.3 g/dL (ref 30.0–36.0)
MCV: 89.5 fL (ref 80.0–100.0)
Platelets: 271 10*3/uL (ref 150–400)
RBC: 4.74 MIL/uL (ref 4.22–5.81)
RDW: 15.2 % (ref 11.5–15.5)
WBC: 7.2 10*3/uL (ref 4.0–10.5)
nRBC: 0 % (ref 0.0–0.2)

## 2022-01-27 LAB — MRSA NEXT GEN BY PCR, NASAL: MRSA by PCR Next Gen: NOT DETECTED

## 2022-01-27 LAB — HEMOGLOBIN A1C
Hgb A1c MFr Bld: 5.9 % — ABNORMAL HIGH (ref 4.8–5.6)
Mean Plasma Glucose: 122.63 mg/dL

## 2022-01-27 LAB — HIV ANTIBODY (ROUTINE TESTING W REFLEX): HIV Screen 4th Generation wRfx: NONREACTIVE

## 2022-01-27 LAB — MAGNESIUM: Magnesium: 2.3 mg/dL (ref 1.7–2.4)

## 2022-01-27 LAB — PHOSPHORUS: Phosphorus: 3.7 mg/dL (ref 2.5–4.6)

## 2022-01-27 LAB — GLUCOSE, CAPILLARY: Glucose-Capillary: 119 mg/dL — ABNORMAL HIGH (ref 70–99)

## 2022-01-27 MED ORDER — THIAMINE HCL 100 MG/ML IJ SOLN: 100.0000 mg | Freq: Every day | INTRAMUSCULAR | Status: DC

## 2022-01-27 MED ORDER — NALOXONE HCL 4 MG/0.1ML NA LIQD
NASAL | 3 refills | Status: DC
Start: 2022-01-27 — End: 2022-11-22

## 2022-01-27 MED ORDER — THIAMINE HCL 100 MG PO TABS
100.0000 mg | ORAL_TABLET | Freq: Every day | ORAL | Status: DC
  Administered 2022-01-27: 100 mg via ORAL
  Filled 2022-01-27: qty 1

## 2022-01-27 MED ORDER — NALOXONE HCL 0.4 MG/ML IJ SOLN: 0.4000 mg | INTRAMUSCULAR | Status: DC | PRN

## 2022-01-27 MED ORDER — CHLORHEXIDINE GLUCONATE CLOTH 2 % EX PADS: 6.0000 | MEDICATED_PAD | Freq: Every day | CUTANEOUS | Status: DC

## 2022-01-27 MED ORDER — FOLIC ACID 1 MG PO TABS
1.0000 mg | ORAL_TABLET | Freq: Every day | ORAL | Status: DC
  Administered 2022-01-27: 1 mg via ORAL
  Filled 2022-01-27: qty 1

## 2022-01-27 MED ORDER — ADULT MULTIVITAMIN W/MINERALS CH
1.0000 | ORAL_TABLET | Freq: Every day | ORAL | Status: DC
  Administered 2022-01-27: 1 via ORAL
  Filled 2022-01-27: qty 1

## 2022-01-27 NOTE — Progress Notes (Signed)
D/C education given to Pt and all questions answered. No printed prescriptions to give, or equipment to deliver, match letter for medication and work note given to Pt . IV removed. Pt taken to car with all belongings.

## 2022-01-27 NOTE — ED Notes (Signed)
Spoke to Dr.Demaio regarding Narcan drip; medication to be discontinued, pt ok to continue with progressive bed.

## 2022-01-27 NOTE — ED Notes (Signed)
Admitting paged for change in bed request

## 2022-01-27 NOTE — Progress Notes (Signed)
Ok for RN to D/C cardiac monitoring at this time per MD.

## 2022-01-27 NOTE — ED Notes (Addendum)
Secure chat sent to Dr. Darrick Huntsman regarding need for upgrade to ICU bed request due to narcan drip

## 2022-01-27 NOTE — Progress Notes (Signed)
SATURATION QUALIFICATIONS:   Patient Saturations on Room Air at Rest = 99%  Patient Saturations on Room Air while Ambulating = 97%  Pt had no complaints while ambulating.

## 2022-01-27 NOTE — Discharge Instructions (Signed)
It was a pleasure meeting you Mr. Alexander Mccarthy.   If you would like to we have a clinic on the bottom floor of [facility] and would love to have you establish care with [place].   We have also provided you some resources regarding help for stopping some of the substances you are currently using.    Substance Abuse and Mental Health Services Administration, if you need further information.  [phone]  If you feel you want to harm yourself or others please dial [phone] and come to the emergency room.

## 2022-01-27 NOTE — TOC Transition Note (Addendum)
Transition of Care [facility]) - CM/SW Discharge Note   Patient Details  Name: Alexander Mccarthy MRN: [id number] Date of Birth: November 22, 1981  [facility] CM/SW Contact:  Leone Haven, RN Phone Number: 01/27/2022, 1:26 PM   Clinical Narrative:    Patient is for dc today, he states his Mom will be transporting him home today.  He is indep, he states he does not have any funds for the Narcan. Informed patient to cal the [facility] tomorrow to schedule him a hospital follow up apt, information on AVS. NCM assisted patient with Match Letter for medication. NCM gave patient SA resources also.   Final next level of care: Home/Self Care Barriers to Discharge: No Barriers Identified   Patient Goals and CMS Choice Patient states their goals for this hospitalization and ongoing recovery are:: return home with wife   Choice offered to / list presented to : NA  Discharge Placement                       Discharge Plan and Services                  DME Agency: NA       HH Arranged: NA          Social Determinants of Health (SDOH) Interventions     Readmission Risk Interventions No flowsheet data found.

## 2022-01-27 NOTE — Discharge Summary (Addendum)
Name: Alexander Mccarthy MRN: [id number] DOB: [birth year] 41 y.o. PCP: Patient, No Pcp Per (Inactive)  Date of Admission: 01/26/2022  2:19 PM Date of Discharge: No discharge date for patient encounter. Attending Physician: Charise Killian, MD  Discharge Diagnosis: 1. Acute opoid intoxication   Discharge Medications: Allergies as of 01/27/2022   No Known Allergies      Medication List     STOP taking these medications    cyclobenzaprine 10 MG tablet Commonly known as: FLEXERIL   ondansetron 4 MG disintegrating tablet Commonly known as: Zofran ODT   predniSONE 10 MG tablet Commonly known as: DELTASONE   traMADol 50 MG tablet Commonly known as: ULTRAM       TAKE these medications    naloxone 4 MG/0.1ML Liqd nasal spray kit Commonly known as: NARCAN 4 or 8 mg (contents of 1 nasal spray) as a single dose in one nostril; may repeat every 2 to 3 minutes in alternating nostrils until medical assistance becomes available; may repeat in 3 to 5 minutes if respiratory depression persists   naproxen 500 MG tablet Commonly known as: NAPROSYN Take 1 tablet (500 mg total) by mouth 2 (two) times daily.        Disposition and follow-up:   Alexander Mccarthy was discharged from [facility] in Good condition.  At the hospital follow up visit please address:  1.  Cocaine use disorder  2.  Labs / imaging needed at time of follow-up: BMP, unclear baseline but serum creatinine mildly elevated.   3.  Pending labs/ test needing follow-up: None  Follow-up Appointments:   Sent message to our clinic for hospital appointment visit.   Hospital Course by problem list:   Alexander Mccarthy is a 41 y.o. with known PMH of cocaine use disorder admitted after an accidental overdose.  1.  #Opioid Overdose #Cocaine Use Disorder Patient was given NARCAN x2 in the field and 2x IV narcan while hospitalized. He was eventually started on narcan drip which was discontinued  after 4 hours.  Patient's mental status improved and he never required intubation.On the day of discharge patient had O2 saturations in the upper 90s on room air and was back to his baseline level of mentation.  Patient's BP was normal on discharge.  CXR was unremarkable. Patient denied any current chest pain and EKG had no concerning findings for acute ischemia.  -He will discharge home with narcan and instructions to follow up with [place] in clinic in 1-2 weeks. In addition he will be given resources for assistance to stop cocaine use.   #Possible AKI  Patient with elevation in sCr from 4 years ago. No recent laboratory information however. This improved by the time of discharge and patient was given instructions to follow up with a PCP in 1-2 weeks for repeat BMP to check his kidney function. He was also counseled on cessation of cocaine given its effect on the heart and blood pressure.   Discharge Exam:   BP (!) 114/52 (BP Location: Left Arm)    Pulse 76    Temp 98.2 F (36.8 C) (Oral)    Resp 14    Ht _0  (1.854 m)    Wt 131.2 kg    SpO2 99%    BMI 38.16 kg/m  Discharge exam:   Constitutional: Well-developed, well-nourished, and in no distress.  HENT:  Head: Normocephalic and atraumatic.  Eyes: EOM are normal.  Neck: Normal range of motion.  Cardiovascular: Normal rate, regular  rhythm, intact distal pulses. No gallop and no friction rub.  No murmur heard. No lower extremity edema  Pulmonary: Non labored breathing on room air, no wheezing or rales  Abdominal: Soft. Normal bowel sounds. Non distended and non tender Musculoskeletal: Normal range of motion.        General: No tenderness or edema.  Neurological: Alert and oriented to person, place, and time. Non focal  Skin: Skin is warm and dry.    Pertinent Labs, Studies, and Procedures:  0 Result Notes           Component Ref Range & Units 04:16 (01/27/22) 1 d ago (01/26/22) 4 yr ago (12/18/17) 4 yr ago (08/13/17) 4 yr ago (04/15/17)  12 yr ago (09/06/09) 12 yr ago (04/12/09)  Sodium 135 - 145 mmol/L 139  135  137  136  137  137 R  136 R   Potassium 3.5 - 5.1 mmol/L 3.9  4.2  3.9  4.0  4.0  4.3 R  4.3 R   Chloride 98 - 111 mmol/L 103  101  101 R  103 R  102 R  102 R  101 R   CO2 22 - 32 mmol/L _0 R  28 R   Glucose, Bld 70 - 99 mg/dL 76  106 High  CM  129 High  R  107 High  R  128 High  R  105 High   106 High    Comment: Glucose reference range applies only to samples taken after fasting for at least 8 hours.  BUN 6 - 20 mg/dL _1 R  10 R   Creatinine, Ser 0.61 - 1.24 mg/dL 1.23  1.27 High   1.12  1.21  1.12  1.35 R  1.24 R   Calcium 8.9 - 10.3 mg/dL 8.6 Low   8.5 Low   8.5 Low   9.4  9.0  9.7 R  9.6 R   Total Protein 6.5 - 8.1 g/dL 6.5  6.8   7.8  8.0     Albumin 3.5 - 5.0 g/dL 3.5  3.8   4.1  3.8     AST 15 - 41 U/L _2 ALT 0 - 44 U/L _3 R  18 R     Alkaline Phosphatase 38 - 126 U/L 70  72   63  92     Total Bilirubin 0.3 - 1.2 mg/dL 0.6  0.5   0.8  0.5     GFR, Estimated >60 mL/min >60  >60 CM        Comment: (NOTE)       Discharge Instructions: It was a pleasure meeting you Mr. Gotts.   If you would like to we have a clinic on the bottom floor of  hospital and would love to have you establish care with [place].   We have also provided you some resources regarding help for stopping some of the substances you are currently using.    Substance Abuse and Mental Health Services Administration, if you need further information.  [phone]  If you feel you want to harm yourself or others please dial [phone] and come to the emergency room.   Signed: Rick Duff, MD 01/27/2022, 1:19 PM   Pager: [phone]

## 2022-01-27 NOTE — ED Notes (Signed)
Report given to rn on 4n 

## 2022-02-06 ENCOUNTER — Encounter: Payer: Self-pay | Admitting: Student

## 2022-10-30 ENCOUNTER — Other Ambulatory Visit: Payer: Self-pay

## 2022-10-30 ENCOUNTER — Encounter (HOSPITAL_COMMUNITY): Payer: Self-pay | Admitting: Emergency Medicine

## 2022-10-30 ENCOUNTER — Ambulatory Visit (HOSPITAL_COMMUNITY)
Admission: EM | Admit: 2022-10-30 | Discharge: 2022-10-30 | Disposition: A | Discharge status: Home / Self Care | Payer: Self-pay | Attending: Emergency Medicine | Admitting: Emergency Medicine

## 2022-10-30 DIAGNOSIS — M7989 Other specified soft tissue disorders: Secondary | ICD-10-CM | POA: Insufficient documentation

## 2022-10-30 LAB — CBC WITH DIFFERENTIAL/PLATELET
Abs Immature Granulocytes: 0.02 10*3/uL (ref 0.00–0.07)
Basophils Absolute: 0.1 10*3/uL (ref 0.0–0.1)
Basophils Relative: 1 %
Eosinophils Absolute: 0.3 10*3/uL (ref 0.0–0.5)
Eosinophils Relative: 7 %
HCT: 47.9 % (ref 39.0–52.0)
Hemoglobin: 15.5 g/dL (ref 13.0–17.0)
Immature Granulocytes: 0 %
Lymphocytes Relative: 46 %
Lymphs Abs: 2.3 10*3/uL (ref 0.7–4.0)
MCH: 29.2 pg (ref 26.0–34.0)
MCHC: 32.4 g/dL (ref 30.0–36.0)
MCV: 90.2 fL (ref 80.0–100.0)
Monocytes Absolute: 0.5 10*3/uL (ref 0.1–1.0)
Monocytes Relative: 10 %
Neutro Abs: 1.7 10*3/uL (ref 1.7–7.7)
Neutrophils Relative %: 36 %
Platelets: 283 10*3/uL (ref 150–400)
RBC: 5.31 MIL/uL (ref 4.22–5.81)
RDW: 15.3 % (ref 11.5–15.5)
WBC: 4.9 10*3/uL (ref 4.0–10.5)
nRBC: 0 % (ref 0.0–0.2)

## 2022-10-30 LAB — COMPREHENSIVE METABOLIC PANEL
ALT: 31 U/L (ref 0–44)
AST: 34 U/L (ref 15–41)
Albumin: 4.2 g/dL (ref 3.5–5.0)
Alkaline Phosphatase: 61 U/L (ref 38–126)
Anion gap: 6 (ref 5–15)
BUN: 12 mg/dL (ref 6–20)
CO2: 28 mmol/L (ref 22–32)
Calcium: 9 mg/dL (ref 8.9–10.3)
Chloride: 103 mmol/L (ref 98–111)
Creatinine, Ser: 1.4 mg/dL — ABNORMAL HIGH (ref 0.61–1.24)
GFR, Estimated: >60 mL/min (ref >60)
Glucose, Bld: 112 mg/dL — ABNORMAL HIGH (ref 70–99)
Potassium: 4.4 mmol/L (ref 3.5–5.1)
Sodium: 137 mmol/L (ref 135–145)
Total Bilirubin: 0.6 mg/dL (ref 0.3–1.2)
Total Protein: 7.7 g/dL (ref 6.5–8.1)

## 2022-10-30 LAB — POCT URINALYSIS DIPSTICK, ED / UC
Bilirubin Urine: NEGATIVE
Glucose, UA: NEGATIVE mg/dL
Ketones, ur: NEGATIVE mg/dL
Leukocytes,Ua: NEGATIVE
Nitrite: NEGATIVE
Protein, ur: NEGATIVE mg/dL
Specific Gravity, Urine: 1.025 (ref 1.005–1.030)
Urobilinogen, UA: 0.2 mg/dL (ref 0.0–1.0)
pH: 5.5 (ref 5.0–8.0)

## 2022-10-30 LAB — CBG MONITORING, ED: Glucose-Capillary: 117 mg/dL — ABNORMAL HIGH (ref 70–99)

## 2022-10-30 NOTE — ED Triage Notes (Signed)
Pt reports bilateral hand/wrist swelling x 2 days and left leg pain and swelling x 1 week. States he has a rod in his leg.

## 2022-10-30 NOTE — Discharge Instructions (Addendum)
We will call you if anything returns abnormal in your blood work. Elevate your hands to help reduce swelling.  Please monitor your symptoms and go to the emergency department if they worsen - if you lose sensation in the hands, swelling becomes extensive, you develop severe pain, or develop other symptoms such as trouble breathing.  Follow up with your new primary care provider for further evaluation.
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
## 2022-11-12 ENCOUNTER — Encounter: Payer: Self-pay | Admitting: Family

## 2022-11-15 NOTE — Progress Notes (Signed)
  This encounter was created in error - please disregard. No show 

## 2022-11-22 ENCOUNTER — Ambulatory Visit (INDEPENDENT_AMBULATORY_CARE_PROVIDER_SITE_OTHER): Payer: Self-pay | Admitting: Family

## 2022-11-22 ENCOUNTER — Encounter: Payer: Self-pay | Admitting: Family

## 2022-11-22 ENCOUNTER — Ambulatory Visit
Admission: RE | Admit: 2022-11-22 | Discharge: 2022-11-22 | Disposition: A | Discharge status: Home / Self Care | Payer: Self-pay | Source: Ambulatory Visit | Attending: Family | Admitting: Family

## 2022-11-22 VITALS — BP 122/86 | HR 98 | Temp 98.3°F | Resp 18 | Ht 73.0 in | Wt 310.4 lb

## 2022-11-22 DIAGNOSIS — Z1159 Encounter for screening for other viral diseases: Secondary | ICD-10-CM

## 2022-11-22 DIAGNOSIS — F32A Depression, unspecified: Secondary | ICD-10-CM

## 2022-11-22 DIAGNOSIS — G8929 Other chronic pain: Secondary | ICD-10-CM

## 2022-11-22 DIAGNOSIS — F17210 Nicotine dependence, cigarettes, uncomplicated: Secondary | ICD-10-CM

## 2022-11-22 DIAGNOSIS — F419 Anxiety disorder, unspecified: Secondary | ICD-10-CM

## 2022-11-22 DIAGNOSIS — Z7689 Persons encountering health services in other specified circumstances: Secondary | ICD-10-CM

## 2022-11-22 DIAGNOSIS — Z789 Other specified health status: Secondary | ICD-10-CM

## 2022-11-22 DIAGNOSIS — F1410 Cocaine abuse, uncomplicated: Secondary | ICD-10-CM

## 2022-11-22 DIAGNOSIS — M25552 Pain in left hip: Secondary | ICD-10-CM

## 2022-11-22 DIAGNOSIS — Z23 Encounter for immunization: Secondary | ICD-10-CM

## 2022-11-22 DIAGNOSIS — M25562 Pain in left knee: Secondary | ICD-10-CM

## 2022-11-22 DIAGNOSIS — Z13220 Encounter for screening for lipoid disorders: Secondary | ICD-10-CM

## 2022-11-22 MED ORDER — IBUPROFEN 600 MG PO TABS: 600.0000 mg | ORAL_TABLET | Freq: Three times a day (TID) | ORAL | 0 refills | Status: DC | PRN

## 2022-11-22 MED ORDER — TETANUS-DIPHTH-ACELL PERTUSSIS 5-2.5-18.5 LF-MCG/0.5 IM SUSP: 0.5000 mL | Freq: Once | INTRAMUSCULAR | 0 refills | Status: AC

## 2022-11-22 MED ORDER — BUPROPION HCL ER (XL) 150 MG PO TB24: 150.0000 mg | ORAL_TABLET | Freq: Every day | ORAL | 1 refills | Status: DC
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
## 2022-11-22 NOTE — Patient Instructions (Addendum)
Please contact your local pharmacy, previous provider, or insurance carrier for vaccine/immunization records. Ensure that any procedures done outside of [facility] and Adult [facility] are faxed to [place] [phone] or you can sign release of records form at the front desk to keep your medical record updated.    - Please get left hip and knee X-ray at [facility] imaging at [facility] then will call you with results.  - Please get your tetanus vaccine and COVID-19 vaccine at the pharmacy  ;

## 2022-11-25 ENCOUNTER — Other Ambulatory Visit: Payer: Self-pay

## 2022-11-27 ENCOUNTER — Other Ambulatory Visit: Payer: Self-pay

## 2022-11-27 ENCOUNTER — Other Ambulatory Visit: Payer: Self-pay | Admitting: Family

## 2022-11-27 DIAGNOSIS — G8929 Other chronic pain: Secondary | ICD-10-CM

## 2022-11-27 DIAGNOSIS — Z13220 Encounter for screening for lipoid disorders: Secondary | ICD-10-CM

## 2022-11-27 DIAGNOSIS — F32A Depression, unspecified: Secondary | ICD-10-CM

## 2022-11-27 DIAGNOSIS — Z1159 Encounter for screening for other viral diseases: Secondary | ICD-10-CM

## 2022-11-27 DIAGNOSIS — M25552 Pain in left hip: Secondary | ICD-10-CM

## 2022-11-29 ENCOUNTER — Other Ambulatory Visit: Payer: Self-pay

## 2022-12-01 DIAGNOSIS — F17210 Nicotine dependence, cigarettes, uncomplicated: Secondary | ICD-10-CM | POA: Insufficient documentation

## 2022-12-01 DIAGNOSIS — F1410 Cocaine abuse, uncomplicated: Secondary | ICD-10-CM | POA: Insufficient documentation

## 2022-12-01 DIAGNOSIS — F32A Depression, unspecified: Secondary | ICD-10-CM | POA: Insufficient documentation

## 2022-12-01 DIAGNOSIS — Z789 Other specified health status: Secondary | ICD-10-CM | POA: Insufficient documentation

## 2022-12-04 ENCOUNTER — Other Ambulatory Visit: Payer: Self-pay

## 2022-12-27 ENCOUNTER — Ambulatory Visit: Payer: Self-pay | Admitting: Family

## 2022-12-31 ENCOUNTER — Encounter: Payer: Self-pay | Admitting: Family

## 2022-12-31 NOTE — Progress Notes (Signed)
  This encounter was created in error - please disregard. No show 

## 2023-05-09 ENCOUNTER — Ambulatory Visit (HOSPITAL_COMMUNITY)
Admission: EM | Admit: 2023-05-09 | Discharge: 2023-05-09 | Disposition: A | Discharge status: Home / Self Care | Payer: Self-pay | Attending: Family Medicine | Admitting: Family Medicine

## 2023-05-09 ENCOUNTER — Encounter (HOSPITAL_COMMUNITY): Payer: Self-pay | Admitting: *Deleted

## 2023-05-09 DIAGNOSIS — M79604 Pain in right leg: Secondary | ICD-10-CM

## 2023-05-09 DIAGNOSIS — M79605 Pain in left leg: Secondary | ICD-10-CM

## 2023-05-09 MED ORDER — KETOROLAC TROMETHAMINE 30 MG/ML IJ SOLN
INTRAMUSCULAR | Status: AC
  Filled 2023-05-09: qty 1

## 2023-05-09 MED ORDER — KETOROLAC TROMETHAMINE 30 MG/ML IJ SOLN
30.0000 mg | Freq: Once | INTRAMUSCULAR | Status: AC
  Administered 2023-05-09: 30 mg via INTRAMUSCULAR

## 2023-05-09 MED ORDER — TIZANIDINE HCL 4 MG PO TABS: 4.0000 mg | ORAL_TABLET | Freq: Four times a day (QID) | ORAL | 0 refills | Status: DC | PRN

## 2023-05-09 NOTE — Discharge Instructions (Signed)
You were seen today for leg pain.  I have given you a shot of toradol today, and sent out a muscle relaxer to your pharmacy. This could make you tired/sleepy so please take when home and not driving.  You may take tylenol/motrin for pain, and use a heating pad as well.  Please follow up if not improving.

## 2023-05-09 NOTE — ED Provider Notes (Signed)
[facility]    CSN: [id number] Arrival date & time: 05/09/23  1120      History   Chief Complaint Chief Complaint  Patient presents with   Leg Pain    HPI Alexander Mccarthy is a 42 y.o. male.   Patient is here for bilateral leg pain.  The left knee was swollen  and painful for a couple of days.  Still with pain but not as swollen.  He has had this drained in the past.  Pain is mostly in the thighs bilaterally;  spasms last night, whole leg would lock up.  Nothing more strenuous these days.  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  No otc meds used.  No recent tick bites.         Past Medical History:  Diagnosis Date   Elevated blood pressure reading without diagnosis of hypertension 05/01/2017   Hyperglycemia 05/01/2017   Lower extremity pain 05/01/2017   Palpitations 05/01/2017    Patient Active Problem List   Diagnosis Date Noted   Cigarette smoker 12/01/2022   Anxiety and depression 12/01/2022   Cocaine use disorder (HCC) 12/01/2022   Alcohol use 12/01/2022   Opioid overdose (HCC) 01/26/2022   Lower extremity pain 05/01/2017   Palpitations 05/01/2017   Elevated blood pressure reading without diagnosis of hypertension 05/01/2017   Hyperglycemia 05/01/2017    Past Surgical History:  Procedure Laterality Date   FEMUR FRACTURE SURGERY     LEG SURGERY         Home Medications    Prior to Admission medications   Medication Sig Start Date End Date Taking? Authorizing Provider  buPROPion (WELLBUTRIN XL) 150 MG 24 hr tablet Take 1 tablet (150 mg total) by mouth daily. 11/22/22   Ngetich, Dinah C, NP  ibuprofen (ADVIL) 600 MG tablet Take 1 tablet (600 mg total) by mouth every 8 (eight) hours as needed. 11/22/22   Ngetich, Dinah C, NP  Multiple Vitamin (ONE-A-DAY MENS PO) Take 1 tablet by mouth daily.    [provider]    Family History Family History  Problem Relation Age of Onset   Hypertension Mother    Heart attack Sister     Hypertension Maternal Uncle    Asthma Other     Social History Social History   Tobacco Use   Smoking status: Some Days    Packs/day: 0.50    Years: 10.00    Additional pack years: 0.00    Total pack years: 5.00    Types: Cigarettes   Smokeless tobacco: Never  Vaping Use   Vaping Use: Never used  Substance Use Topics   Alcohol use: Yes    Comment: 2 drinks weekly.   Drug use: Not Currently    Types: Cocaine     Allergies   Patient has no known allergies.   Review of Systems Review of Systems  Constitutional: Negative.   HENT: Negative.    Respiratory: Negative.    Cardiovascular: Negative.   Gastrointestinal: Negative.   Musculoskeletal:  Positive for myalgias.     Physical Exam Triage Vital Signs ED Triage Vitals  Enc Vitals Group     BP 05/09/23 1217 127/77     Pulse Rate 05/09/23 1217 79     Resp --      Temp 05/09/23 1217 (!) 97.4 F (36.3 C)     Temp Source 05/09/23 1217 Oral     SpO2 05/09/23 1217 95 %     Weight --  Height --      Head Circumference --      Peak Flow --      Pain Score 05/09/23 1219 9     Pain Loc --      Pain Edu? --      Excl. in GC? --    No data found.  Updated Vital Signs BP 127/77   Pulse 79   Temp (!) 97.4 F (36.3 C) (Oral)   SpO2 95%   Visual Acuity Right Eye Distance:   Left Eye Distance:   Bilateral Distance:    Right Eye Near:   Left Eye Near:    Bilateral Near:     Physical Exam Constitutional:      Appearance: Normal appearance.  Cardiovascular:     Rate and Rhythm: Normal rate and regular rhythm.  Pulmonary:     Effort: Pulmonary effort is normal.     Breath sounds: Normal breath sounds.  Musculoskeletal:     Comments: No ttp to the spine or low back;  +TTP to palpation to the thighs bilaterally, left worse than right;  Full rom with pain with flexion/rotation of the hips;   Skin:    General: Skin is warm and dry.     Findings: No bruising, erythema, lesion or rash.  Neurological:      General: No focal deficit present.     Mental Status: He is alert.  Psychiatric:        Mood and Affect: Mood normal.      UC Treatments / Results  Labs (all labs ordered are listed, but only abnormal results are displayed) Labs Reviewed - No data to display  EKG   Radiology No results found.  Procedures Procedures (including critical care time)  Medications Ordered in UC Medications  ketorolac (TORADOL) 30 MG/ML injection 30 mg (has no administration in time range)    Initial Impression / Assessment and Plan / UC Course  I have reviewed the triage vital signs and the nursing notes.  Pertinent labs & imaging results that were available during my care of the patient were reviewed by me and considered in my medical decision making (see chart for details).  Final Clinical Impressions(s) / UC Diagnoses   Final diagnoses:  Bilateral leg pain     Discharge Instructions      You were seen today for leg pain.  I have given you a shot of toradol today, and sent out a muscle relaxer to your pharmacy. This could make you tired/sleepy so please take when home and not driving.  You may take tylenol/motrin for pain, and use a heating pad as well.  Please follow up if not improving.      ED Prescriptions     Medication Sig Dispense Auth. Provider   tiZANidine (ZANAFLEX) 4 MG tablet Take 1 tablet (4 mg total) by mouth every 6 (six) hours as needed for muscle spasms. 30 tablet Jannifer Franklin, MD      PDMP not reviewed this encounter.   Jannifer Franklin, MD 05/09/23 1258

## 2023-05-09 NOTE — ED Triage Notes (Signed)
C/O pain and muscle tenderness to bilat medial thighs onset 2 days ago; states had some left knee swelling 3 days ago. Denies any unusual activity but describes physically demanding job. Denies parasthesias. C/O slight low back pain. Denies any incontinence.

## 2023-05-09 NOTE — ED Notes (Signed)
T/C to call pt into building to go to exam room -- no response, no VM set up.

## 2023-06-05 ENCOUNTER — Other Ambulatory Visit: Payer: Self-pay

## 2023-06-05 ENCOUNTER — Emergency Department (HOSPITAL_COMMUNITY)
Admission: EM | Admit: 2023-06-05 | Discharge: 2023-06-05 | Disposition: A | Discharge status: Home / Self Care | Payer: Self-pay | Attending: Emergency Medicine | Admitting: Emergency Medicine

## 2023-06-05 ENCOUNTER — Encounter (HOSPITAL_COMMUNITY): Payer: Self-pay

## 2023-06-05 DIAGNOSIS — X58XXXA Exposure to other specified factors, initial encounter: Secondary | ICD-10-CM | POA: Insufficient documentation

## 2023-06-05 DIAGNOSIS — S0502XA Injury of conjunctiva and corneal abrasion without foreign body, left eye, initial encounter: Secondary | ICD-10-CM | POA: Insufficient documentation

## 2023-06-05 DIAGNOSIS — R030 Elevated blood-pressure reading, without diagnosis of hypertension: Secondary | ICD-10-CM | POA: Insufficient documentation

## 2023-06-05 DIAGNOSIS — R739 Hyperglycemia, unspecified: Secondary | ICD-10-CM | POA: Insufficient documentation

## 2023-06-05 MED ORDER — ERYTHROMYCIN 5 MG/GM OP OINT: TOPICAL_OINTMENT | OPHTHALMIC | 0 refills | Status: DC

## 2023-06-05 MED ORDER — ERYTHROMYCIN 5 MG/GM OP OINT
TOPICAL_OINTMENT | Freq: Once | OPHTHALMIC | Status: AC
  Administered 2023-06-05: 1 via OPHTHALMIC
  Filled 2023-06-05: qty 3.5

## 2023-06-05 MED ORDER — TETRACAINE HCL 0.5 % OP SOLN
1.0000 [drp] | Freq: Once | OPHTHALMIC | Status: AC
  Administered 2023-06-05: 1 [drp] via OPHTHALMIC
  Filled 2023-06-05: qty 4

## 2023-06-05 MED ORDER — KETOROLAC TROMETHAMINE 30 MG/ML IJ SOLN
30.0000 mg | Freq: Once | INTRAMUSCULAR | Status: AC
  Administered 2023-06-05: 30 mg via INTRAMUSCULAR
  Filled 2023-06-05: qty 1

## 2023-06-05 MED ORDER — FLUORESCEIN SODIUM 1 MG OP STRP
1.0000 | ORAL_STRIP | Freq: Once | OPHTHALMIC | Status: AC
Start: 2023-06-05 — End: 2023-06-05
  Administered 2023-06-05: 1 via OPHTHALMIC
  Filled 2023-06-05: qty 1

## 2023-06-05 NOTE — ED Triage Notes (Signed)
Patient reports he woke up around 2pm feeling like something was in his left eye, states vision is blurry in that eye and watery. Denies any injury.

## 2023-06-05 NOTE — Discharge Instructions (Signed)
Please use your antibiotics ointment as prescribed. Take tylenol/ibuprofen every 6 hours for pain. I recommend close follow-up with ophthalmology for reevaluation.  Please do not hesitate to return to emergency department if worrisome signs symptoms we discussed become apparent.

## 2023-06-05 NOTE — ED Provider Notes (Signed)
[place] [facility] AT [facility] Provider Note   CSN: [id number] Arrival date & time: 06/05/23  2025     History  Chief Complaint  Patient presents with   Eye Pain    Alexander Mccarthy is a 42 y.o. male with no significant past medical history presents today for evaluation of eye pain.  Patient reports he woke up around 2 PM feeling like something was in his left eye.  States his left eye is blurry and watery.  Denies any direct injury to his eye.    Eye Pain      Past Medical History:  Diagnosis Date   Elevated blood pressure reading without diagnosis of hypertension 05/01/2017   Hyperglycemia 05/01/2017   Lower extremity pain 05/01/2017   Palpitations 05/01/2017   Past Surgical History:  Procedure Laterality Date   FEMUR FRACTURE SURGERY     LEG SURGERY       Home Medications Prior to Admission medications   Medication Sig Start Date End Date Taking? Authorizing Provider  buPROPion (WELLBUTRIN XL) 150 MG 24 hr tablet Take 1 tablet (150 mg total) by mouth daily. 11/22/22   Ngetich, Dinah C, NP  ibuprofen (ADVIL) 600 MG tablet Take 1 tablet (600 mg total) by mouth every 8 (eight) hours as needed. 11/22/22   Ngetich, Dinah C, NP  Multiple Vitamin (ONE-A-DAY MENS PO) Take 1 tablet by mouth daily.    [provider]  tiZANidine (ZANAFLEX) 4 MG tablet Take 1 tablet (4 mg total) by mouth every 6 (six) hours as needed for muscle spasms. 05/09/23   Jannifer Franklin, MD      Allergies    Patient has no known allergies.    Review of Systems   Review of Systems  Eyes:  Positive for pain.    Physical Exam Updated Vital Signs BP (!) 175/114 (BP Location: Right Arm)   Pulse 81   Temp 99.6 F (37.6 C) (Oral)   Resp 18   Ht 6\' 1"  (1.854 m)   Wt 136 kg   SpO2 100%   BMI 39.56 kg/m  Physical Exam Vitals and nursing note reviewed.  Constitutional:      Appearance: Normal appearance.  HENT:     Head: Normocephalic and atraumatic.      Mouth/Throat:     Mouth: Mucous membranes are moist.  Eyes:     General: No scleral icterus.    Comments: Positive fluorescein uptake on the left cornea. Negative Seidel sign. No foreigne body noted on the cornea or under the eyelids.  Cardiovascular:     Rate and Rhythm: Normal rate and regular rhythm.     Pulses: Normal pulses.     Heart sounds: Normal heart sounds.  Pulmonary:     Effort: Pulmonary effort is normal.     Breath sounds: Normal breath sounds.  Abdominal:     General: Abdomen is flat.     Palpations: Abdomen is soft.     Tenderness: There is no abdominal tenderness.  Musculoskeletal:        General: No deformity.  Skin:    General: Skin is warm.     Findings: No rash.  Neurological:     General: No focal deficit present.     Mental Status: He is alert.  Psychiatric:        Mood and Affect: Mood normal.     ED Results / Procedures / Treatments   Labs (all labs ordered are listed, but only abnormal  results are displayed) Labs Reviewed - No data to display  EKG None  Radiology No results found.  Procedures Procedures    Medications Ordered in ED Medications  fluorescein ophthalmic strip 1 strip (1 strip Left Eye Given by Other 06/05/23 2138)  tetracaine (PONTOCAINE) 0.5 % ophthalmic solution 1 drop (1 drop Left Eye Given by Other 06/05/23 2138)    ED Course/ Medical Decision Making/ A&P                             Medical Decision Making Risk Prescription drug management.   This patient presents to the ED for left eye pain, this involves an extensive number of treatment options, and is a complaint that carries with a high risk of complications and morbidity.  The differential diagnosis includes corneal abrasion, conjunctivitis, eyelid laceration, foreign body, traumatic iritis, ulcer, keratitis, scleritis, uveitis, preseptal cellulitis. This is not an exhaustive list.  Problem list/ ED course/ Critical interventions/ Medical management: HPI:  See above Vital signs within normal range and stable throughout visit. Laboratory/imaging studies significant for: See above. On physical examination, patient is afebrile and appears in no acute distress.  There was fluorescein uptake on the left cornea, concerning for cornea abrasion.  Negative Seidel sign.  Left eye was anesthetized with tetracaine eyedrop and copiously irrigated with 50 cc of normal saline.  Eyelid was everted but no signs of foreign body.  Doubt acute angle-closure glaucoma.  Low suspicion for globe rupture, uveitis.  Patient was prescribed erythromycin ointment.  Advised patient to follow-up with ophthalmology for further evaluation and management.  Strict return precaution discussed. I have reviewed the patient home medicines and have made adjustments as needed.  Cardiac monitoring/EKG: The patient was maintained on a cardiac monitor.  I personally reviewed and interpreted the cardiac monitor which showed an underlying rhythm of: sinus rhythm.  Additional history obtained: External records from outside source obtained and reviewed including: Chart review including previous notes, labs, imaging.  Consultations obtained:  Disposition Continued outpatient therapy. Follow-up with ophthalmology recommended for reevaluation of symptoms. Treatment plan discussed with patient.  Pt acknowledged understanding was agreeable to the plan. Worrisome signs and symptoms were discussed with patient, and patient acknowledged understanding to return to the ED if they noticed these signs and symptoms. Patient was stable upon discharge.   This chart was dictated using voice recognition software.  Despite best efforts to proofread,  errors can occur which can change the documentation meaning.          Final Clinical Impression(s) / ED Diagnoses Final diagnoses:  Abrasion of left cornea, initial encounter    Rx / DC Orders ED Discharge Orders          Ordered    erythromycin  ophthalmic ointment        06/05/23 2205              Jeanelle Malling, PA 06/06/23 1054    Glyn Ade, MD 06/09/23 [phone]

## 2023-06-06 ENCOUNTER — Telehealth: Payer: Self-pay

## 2023-06-06 NOTE — Transitions of Care (Post Inpatient/ED Visit) (Signed)
   06/06/2023  Name: Alexander Mccarthy MRN: [id number] DOB: [birth year]  Today's TOC FU Call Status:    Attempted to reach the patient regarding the most recent Inpatient/ED visit.  Follow Up Plan: Additional outreach attempts will be made to reach the patient to complete the Transitions of Care (Post Inpatient/ED visit) call.   Signature : Guss Bunde [facility]

## 2023-09-05 ENCOUNTER — Ambulatory Visit (HOSPITAL_COMMUNITY)
Admission: EM | Admit: 2023-09-05 | Discharge: 2023-09-05 | Disposition: A | Discharge status: Home / Self Care | Payer: Self-pay | Attending: Family Medicine | Admitting: Family Medicine

## 2023-09-05 ENCOUNTER — Encounter (HOSPITAL_COMMUNITY): Payer: Self-pay

## 2023-09-05 DIAGNOSIS — M79605 Pain in left leg: Secondary | ICD-10-CM

## 2023-09-05 MED ORDER — KETOROLAC TROMETHAMINE 30 MG/ML IJ SOLN
INTRAMUSCULAR | Status: AC
  Filled 2023-09-05: qty 1

## 2023-09-05 MED ORDER — KETOROLAC TROMETHAMINE 10 MG PO TABS: 10.0000 mg | ORAL_TABLET | Freq: Four times a day (QID) | ORAL | 0 refills | Status: AC | PRN

## 2023-09-05 MED ORDER — KETOROLAC TROMETHAMINE 30 MG/ML IJ SOLN
30.0000 mg | Freq: Once | INTRAMUSCULAR | Status: AC
  Administered 2023-09-05: 30 mg via INTRAMUSCULAR

## 2023-09-05 MED ORDER — TIZANIDINE HCL 4 MG PO TABS: 4.0000 mg | ORAL_TABLET | Freq: Three times a day (TID) | ORAL | 0 refills | Status: AC | PRN

## 2023-09-05 NOTE — ED Provider Notes (Signed)
[facility]    CSN: [id number] Arrival date & time: 09/05/23  1112      History   Chief Complaint Chief Complaint  Patient presents with   Leg Pain    HPI Alexander Mccarthy is a 42 y.o. male.    Leg Pain Here for pain in his left leg.  He has had this pain since September 10.  He has been taking ibuprofen and Tylenol with no relief.  Now his low back is also hurting him some, he thinks because he is leaning over because of the left leg pain.  He does have a history of a left femur fracture about 2009  He reports that in the past muscle relaxers have been helpful.  He does not have a primary care  No f/c/rash/dysuria/hematuria.  Drives a forklift.  Ibuprofen and tylenol not helping.    Past Medical History:  Diagnosis Date   Elevated blood pressure reading without diagnosis of hypertension 05/01/2017   Hyperglycemia 05/01/2017   Lower extremity pain 05/01/2017   Palpitations 05/01/2017    Patient Active Problem List   Diagnosis Date Noted   Cigarette smoker 12/01/2022   Anxiety and depression 12/01/2022   Cocaine use disorder (HCC) 12/01/2022   Alcohol use 12/01/2022   Opioid overdose (HCC) 01/26/2022   Lower extremity pain 05/01/2017   Palpitations 05/01/2017   Elevated blood pressure reading without diagnosis of hypertension 05/01/2017   Hyperglycemia 05/01/2017    Past Surgical History:  Procedure Laterality Date   FEMUR FRACTURE SURGERY     LEG SURGERY         Home Medications    Prior to Admission medications   Medication Sig Start Date End Date Taking? Authorizing Provider  ketorolac (TORADOL) 10 MG tablet Take 1 tablet (10 mg total) by mouth every 6 (six) hours as needed (pain). 09/05/23  Yes Valgene Deloatch, Janace Aris, MD  tiZANidine (ZANAFLEX) 4 MG tablet Take 1 tablet (4 mg total) by mouth every 8 (eight) hours as needed for muscle spasms. 09/05/23  Yes Zenia Resides, MD    Family History Family History  Problem Relation Age of  Onset   Hypertension Mother    Heart attack Sister    Hypertension Maternal Uncle    Asthma Other     Social History Social History   Tobacco Use   Smoking status: Some Days    Current packs/day: 0.50    Average packs/day: 0.5 packs/day for 10.0 years (5.0 ttl pk-yrs)    Types: Cigarettes   Smokeless tobacco: Never  Vaping Use   Vaping status: Never Used  Substance Use Topics   Alcohol use: Yes    Comment: 2 drinks weekly.   Drug use: Not Currently    Types: Cocaine     Allergies   Patient has no known allergies.   Review of Systems Review of Systems   Physical Exam Triage Vital Signs ED Triage Vitals  Encounter Vitals Group     BP 09/05/23 1143 (!) 151/86     Systolic BP Percentile --      Diastolic BP Percentile --      Pulse Rate 09/05/23 1143 96     Resp 09/05/23 1143 18     Temp 09/05/23 1143 99.8 F (37.7 C)     Temp Source 09/05/23 1143 Oral     SpO2 09/05/23 1143 99 %     Weight --      Height --      Head Circumference --  Peak Flow --      Pain Score 09/05/23 1144 8     Pain Loc --      Pain Education --      Exclude from Growth Chart --    No data found.  Updated Vital Signs BP (!) 151/86 (BP Location: Right Arm)   Pulse 96   Temp 99.8 F (37.7 C) (Oral)   Resp 18   SpO2 99%   Visual Acuity Right Eye Distance:   Left Eye Distance:   Bilateral Distance:    Right Eye Near:   Left Eye Near:    Bilateral Near:     Physical Exam Vitals reviewed.  Constitutional:      General: He is not in acute distress.    Appearance: He is not ill-appearing, toxic-appearing or diaphoretic.  Cardiovascular:     Rate and Rhythm: Normal rate and regular rhythm.  Musculoskeletal:     Comments: He has some tenderness of the left anterior thigh.  Straight leg raise is negative.  Skin:    Coloration: Skin is not pale.  Neurological:     General: No focal deficit present.     Mental Status: He is alert and oriented to person, place, and time.   Psychiatric:        Behavior: Behavior normal.      UC Treatments / Results  Labs (all labs ordered are listed, but only abnormal results are displayed) Labs Reviewed - No data to display  EKG   Radiology No results found.  Procedures Procedures (including critical care time)  Medications Ordered in UC Medications  ketorolac (TORADOL) 30 MG/ML injection 30 mg (has no administration in time range)    Initial Impression / Assessment and Plan / UC Course  I have reviewed the triage vital signs and the nursing notes.  Pertinent labs & imaging results that were available during my care of the patient were reviewed by me and considered in my medical decision making (see chart for details).     Toradol injection is given here.  Toradol tablets and tizanidine are sent to the pharmacy.  Request made for staff to help him find a PCP.   Final Clinical Impressions(s) / UC Diagnoses   Final diagnoses:  Left leg pain     Discharge Instructions      You have been given a shot of Toradol 30 mg today.  Ketorolac 10 mg tablets--take 1 tablet every 6 hours as needed for pain.  This is the same medicine that is in the shot we just gave you   Take tizanidine 4 mg--1 every 8 hours as needed for muscle spasms; this medication can cause dizziness and sleepiness  You can use the QR code/website at the back of the summary paperwork to schedule yourself a new patient appointment with primary care      ED Prescriptions     Medication Sig Dispense Auth. Provider   ketorolac (TORADOL) 10 MG tablet Take 1 tablet (10 mg total) by mouth every 6 (six) hours as needed (pain). 20 tablet Daylen Hack, Janace Aris, MD   tiZANidine (ZANAFLEX) 4 MG tablet Take 1 tablet (4 mg total) by mouth every 8 (eight) hours as needed for muscle spasms. 15 tablet Arjuna Doeden, Janace Aris, MD      PDMP not reviewed this encounter.   Zenia Resides, MD 09/05/23 [phone]

## 2023-09-05 NOTE — Discharge Instructions (Signed)
You have been given a shot of Toradol 30 mg today.  Ketorolac 10 mg tablets--take 1 tablet every 6 hours as needed for pain.  This is the same medicine that is in the shot we just gave you   Take tizanidine 4 mg--1 every 8 hours as needed for muscle spasms; this medication can cause dizziness and sleepiness   You can use the QR code/website at the back of the summary paperwork to schedule yourself a new patient appointment with primary care

## 2023-09-05 NOTE — ED Triage Notes (Signed)
Pt c/o chronic lt leg pain from fx femur and knee in 2009. C/o pain since Monday. States taking ibuprofen and tylenol with no relief. C/o pain to rt leg pain from pinch nerve.

## 2023-11-24 ENCOUNTER — Encounter (HOSPITAL_COMMUNITY): Payer: Self-pay

## 2023-11-24 ENCOUNTER — Ambulatory Visit (HOSPITAL_COMMUNITY)
Admission: EM | Admit: 2023-11-24 | Discharge: 2023-11-24 | Disposition: A | Discharge status: Home / Self Care | Payer: Self-pay | Attending: Family Medicine | Admitting: Family Medicine

## 2023-11-24 DIAGNOSIS — H60391 Other infective otitis externa, right ear: Secondary | ICD-10-CM

## 2023-11-24 MED ORDER — PREDNISONE 20 MG PO TABS: 20.0000 mg | ORAL_TABLET | Freq: Every day | ORAL | 0 refills | Status: AC

## 2023-11-24 MED ORDER — CEFDINIR 300 MG PO CAPS: 600.0000 mg | ORAL_CAPSULE | Freq: Every day | ORAL | 0 refills | Status: AC

## 2023-11-24 NOTE — ED Triage Notes (Signed)
Patient here today with c/o right ear pain X 2-3 days. He is also having some decreased hearing.

## 2023-11-24 NOTE — Discharge Instructions (Signed)
Take both medications as prescribed.  If symptoms have not completely resolved after completing medication return for evaluation.  As swelling resolve your hearing should return to baseline

## 2023-11-24 NOTE — ED Provider Notes (Signed)
[facility]    CSN: [id number] Arrival date & time: 11/24/23  1232      History   Chief Complaint Chief Complaint  Patient presents with   Otalgia    HPI Alexander Mccarthy is a 42 y.o. male.    Otalgia Patient presents today with a 3-day history of ear pain.  He reports noticing of the last 24 hours that his ear is swollen and he is unable to hear anything out of the right ear.  He denies any recent URI illness.  He has not had any drainage coming from the right ear.  Denies any pain in the left ear.  Denies any history of recurrent ear infections  Past Medical History:  Diagnosis Date   Elevated blood pressure reading without diagnosis of hypertension 05/01/2017   Hyperglycemia 05/01/2017   Lower extremity pain 05/01/2017   Palpitations 05/01/2017    Patient Active Problem List   Diagnosis Date Noted   Cigarette smoker 12/01/2022   Anxiety and depression 12/01/2022   Cocaine use disorder (HCC) 12/01/2022   Alcohol use 12/01/2022   Opioid overdose (HCC) 01/26/2022   Lower extremity pain 05/01/2017   Palpitations 05/01/2017   Elevated blood pressure reading without diagnosis of hypertension 05/01/2017   Hyperglycemia 05/01/2017    Past Surgical History:  Procedure Laterality Date   FEMUR FRACTURE SURGERY     LEG SURGERY         Home Medications    Prior to Admission medications   Medication Sig Start Date End Date Taking? Authorizing Provider  cefdinir (OMNICEF) 300 MG capsule Take 2 capsules (600 mg total) by mouth daily for 7 days. 11/24/23 12/01/23 Yes Bing Neighbors, NP  predniSONE (DELTASONE) 20 MG tablet Take 1 tablet (20 mg total) by mouth daily with breakfast for 5 days. 11/24/23 11/29/23 Yes Bing Neighbors, NP  ketorolac (TORADOL) 10 MG tablet Take 1 tablet (10 mg total) by mouth every 6 (six) hours as needed (pain). 09/05/23   Zenia Resides, MD  tiZANidine (ZANAFLEX) 4 MG tablet Take 1 tablet (4 mg total) by mouth every 8 (eight)  hours as needed for muscle spasms. 09/05/23   Zenia Resides, MD    Family History Family History  Problem Relation Age of Onset   Hypertension Mother    Heart attack Sister    Hypertension Maternal Uncle    Asthma Other     Social History Social History   Tobacco Use   Smoking status: Some Days    Current packs/day: 0.50    Average packs/day: 0.5 packs/day for 10.0 years (5.0 ttl pk-yrs)    Types: Cigarettes   Smokeless tobacco: Never  Vaping Use   Vaping status: Never Used  Substance Use Topics   Alcohol use: Yes    Comment: 2 drinks weekly.   Drug use: Not Currently    Types: Cocaine     Allergies   Patient has no known allergies.   Review of Systems Review of Systems  HENT:  Positive for ear pain.      Physical Exam Triage Vital Signs ED Triage Vitals  Encounter Vitals Group     BP 11/24/23 1402 (!) 150/97     Systolic BP Percentile --      Diastolic BP Percentile --      Pulse Rate 11/24/23 1402 88     Resp 11/24/23 1402 16     Temp 11/24/23 1402 98.5 F (36.9 C)     Temp  Source 11/24/23 1402 Oral     SpO2 11/24/23 1402 96 %     Weight --      Height --      Head Circumference --      Peak Flow --      Pain Score 11/24/23 1401 10     Pain Loc --      Pain Education --      Exclude from Growth Chart --    No data found.  Updated Vital Signs BP (!) 150/97 (BP Location: Left Arm)   Pulse 88   Temp 98.5 F (36.9 C) (Oral)   Resp 16   SpO2 96%   Visual Acuity Right Eye Distance:   Left Eye Distance:   Bilateral Distance:    Right Eye Near:   Left Eye Near:    Bilateral Near:     Physical Exam Constitutional:      Appearance: He is not ill-appearing.  HENT:     Head: Normocephalic and atraumatic.     Right Ear: Decreased hearing noted. Swelling and tenderness present.     Left Ear: Hearing, tympanic membrane, ear canal and external ear normal.     Ears:     Comments: Unable to visualize TM given a few swelling of the ear  canal.  Pain elicited with otoscopic exam and with auricle manipulation Eyes:     Extraocular Movements: Extraocular movements intact.     Pupils: Pupils are equal, round, and reactive to light.  Cardiovascular:     Rate and Rhythm: Normal rate and regular rhythm.  Pulmonary:     Effort: Pulmonary effort is normal.     Breath sounds: Normal breath sounds.  Skin:    General: Skin is warm.  Neurological:     General: No focal deficit present.     Mental Status: He is alert.      UC Treatments / Results  Labs (all labs ordered are listed, but only abnormal results are displayed) Labs Reviewed - No data to display  EKG   Radiology No results found.  Procedures Procedures (including critical care time)  Medications Ordered in UC Medications - No data to display  Initial Impression / Assessment and Plan / UC Course  I have reviewed the triage vital signs and the nursing notes.  Pertinent labs & imaging results that were available during my care of the patient were reviewed by me and considered in my medical decision making (see chart for details).   Acute otitis externa involving the right ear, treatment with prednisone 20 mg once daily for 5 days.  Omnicef 600 mg daily for 7 days.  Strict return precautions given if symptoms worsen or do not improve with treatment. Final diagnoses:  Other infective acute otitis externa of right ear     Discharge Instructions      Take both medications as prescribed.  If symptoms have not completely resolved after completing medication return for evaluation.  As swelling resolve your hearing should return to baseline    ED Prescriptions     Medication Sig Dispense Auth. Provider   predniSONE (DELTASONE) 20 MG tablet Take 1 tablet (20 mg total) by mouth daily with breakfast for 5 days. 5 tablet Bing Neighbors, NP   cefdinir (OMNICEF) 300 MG capsule Take 2 capsules (600 mg total) by mouth daily for 7 days. 14 capsule Bing Neighbors, NP      PDMP not reviewed this encounter.   Bing Neighbors,  NP 11/24/23 1515

## 2023-12-13 ENCOUNTER — Emergency Department (HOSPITAL_COMMUNITY): Payer: Self-pay

## 2023-12-13 ENCOUNTER — Other Ambulatory Visit: Payer: Self-pay

## 2023-12-13 ENCOUNTER — Emergency Department (HOSPITAL_COMMUNITY)
Admission: EM | Admit: 2023-12-13 | Discharge: 2023-12-13 | Disposition: A | Discharge status: Home / Self Care | Payer: Self-pay | Attending: Emergency Medicine | Admitting: Emergency Medicine

## 2023-12-13 ENCOUNTER — Encounter (HOSPITAL_COMMUNITY): Payer: Self-pay

## 2023-12-13 ENCOUNTER — Emergency Department (HOSPITAL_COMMUNITY): Admission: EM | Admit: 2023-12-13 | Payer: Self-pay | Source: Home / Self Care

## 2023-12-13 DIAGNOSIS — H6091 Unspecified otitis externa, right ear: Secondary | ICD-10-CM

## 2023-12-13 DIAGNOSIS — M25562 Pain in left knee: Secondary | ICD-10-CM | POA: Insufficient documentation

## 2023-12-13 DIAGNOSIS — H608X1 Other otitis externa, right ear: Secondary | ICD-10-CM | POA: Insufficient documentation

## 2023-12-13 MED ORDER — AMOXICILLIN-POT CLAVULANATE 875-125 MG PO TABS: 1.0000 | ORAL_TABLET | Freq: Two times a day (BID) | ORAL | 0 refills | Status: AC

## 2023-12-13 MED ORDER — HYDROCODONE-ACETAMINOPHEN 5-325 MG PO TABS
1.0000 | ORAL_TABLET | Freq: Once | ORAL | Status: AC
  Administered 2023-12-13: 1 via ORAL
  Filled 2023-12-13: qty 1

## 2023-12-13 MED ORDER — DEXAMETHASONE SODIUM PHOSPHATE 10 MG/ML IJ SOLN
10.0000 mg | Freq: Once | INTRAMUSCULAR | Status: AC
  Administered 2023-12-13: 10 mg via INTRAMUSCULAR
  Filled 2023-12-13: qty 1

## 2023-12-13 NOTE — ED Triage Notes (Signed)
Pt came in for left knee pain. Pt stated he can barely stand on it or kick it out. Pt also c/o right ear pain that's been going on for two weeks. Pt received medications at UC that helped with the swelling but is having issues hearing out of that ear.

## 2023-12-13 NOTE — ED Provider Notes (Signed)
[name] [facility] AT [facility] Provider Note   CSN: [id number] Arrival date & time: 12/13/23  4034     History  Chief Complaint  Patient presents with   Otalgia        Knee Pain    Alexander Mccarthy is a 42 y.o. male history of cocaine use, opiate overdose, alcohol use presented with right ear pain that has been present for the past [redacted] weeks along with left knee pain.  Patient states that he took the [facility] and prednisone which did help with symptoms however notes still hard to hear out of his right ear.  Patient denies neck rigidity/pain, headache, vision changes, fevers, inability to eat or drink, chest pain, shortness of breath.  Patient states that he is still bothered by the fact that he can barely hear out of his right ear but denies jaw pain.  Patient also notes that he works in [employer] and that his left knee has been hurting.  Patient denies any trauma to the area and states he can still walk on it but that hurts to extend his knee.  Patient states he can still extend his knee however.  Patient states he looks a little swollen but denies any fevers, redness, wounds, paresthesias.  HPI     Home Medications Prior to Admission medications   Medication Sig Start Date End Date Taking? Authorizing Provider  amoxicillin-clavulanate (AUGMENTIN) 875-125 MG tablet Take 1 tablet by mouth 2 (two) times daily for 10 days. 12/13/23 12/23/23 Yes Alexander Mccarthy, Beverly Gust, PA-C  ketorolac (TORADOL) 10 MG tablet Take 1 tablet (10 mg total) by mouth every 6 (six) hours as needed (pain). 09/05/23   Zenia Resides, MD  tiZANidine (ZANAFLEX) 4 MG tablet Take 1 tablet (4 mg total) by mouth every 8 (eight) hours as needed for muscle spasms. 09/05/23   Zenia Resides, MD      Allergies    Patient has no known allergies.    Review of Systems   Review of Systems  Physical Exam Updated Vital Signs BP (!) 152/106   Pulse 96   Temp 98.8 F (37.1 C) (Oral)   Resp  18   SpO2 96%  Physical Exam Constitutional:      General: He is not in acute distress. HENT:     Head: Normocephalic and atraumatic.     Jaw: There is normal jaw occlusion.     Salivary Glands: Right salivary gland is not diffusely enlarged or tender. Left salivary gland is not diffusely enlarged or tender.     Comments: No facial swelling    Right Ear: Hearing and external ear normal.     Left Ear: Hearing, tympanic membrane, ear canal and external ear normal.     Ears:     Comments: Right ear: Edema noted in the ear canal, unable to visualize tympanic membrane, no outside signs of necrotizing otitis externa, no skin color changes or drainage noted No mastoid tenderness    Nose: Nose normal.     Mouth/Throat:     Lips: Pink.     Mouth: Mucous membranes are moist.     Pharynx: Oropharynx is clear. Uvula midline.     Comments: No oral floor swelling Tolerating secretions No purulent drainage no No PTA noted No muffled voice noted Eyes:     Extraocular Movements: Extraocular movements intact.     Conjunctiva/sclera: Conjunctivae normal.     Pupils: Pupils are equal, round, and reactive to light.  Neck:     Comments: No neck swelling Cardiovascular:     Rate and Rhythm: Normal rate and regular rhythm.     Pulses: Normal pulses.     Heart sounds: Normal heart sounds.  Pulmonary:     Effort: Pulmonary effort is normal. No respiratory distress.     Breath sounds: Normal breath sounds.  Musculoskeletal:     Cervical back: Normal range of motion and neck supple. No rigidity or tenderness.     Comments: Left knee: Mild edema, 3 out of 5 knee extension/flexion, negative varus/valgus stress test, anterior/posterior drawer test, no bony tenderness, intact quadricep tendon along with patellar tendon Left hip flexion/extension, plantarflexion/dorsiflexion 5 out of 5 No obvious deformities Soft compartments Pain not out of proportion No warmth, signs of wounds, drainage   Lymphadenopathy:     Cervical: No cervical adenopathy.  Skin:    General: Skin is warm and dry.     Capillary Refill: Capillary refill takes less than 2 seconds.     Findings: No erythema.  Neurological:     Mental Status: He is alert.     Comments: Sensation intact distally Able to bear weight and walk     ED Results / Procedures / Treatments   Labs (all labs ordered are listed, but only abnormal results are displayed) Labs Reviewed - No data to display  EKG None  Radiology DG Knee Complete 4 Views Left Result Date: 12/13/2023 CLINICAL DATA:  Left anterior knee pain for 2 weeks. EXAM: LEFT KNEE - COMPLETE 4+ VIEW COMPARISON:  Knee radiographs dated 11/22/2022. FINDINGS: There is an intramedullary nail with a healed fracture of the distal femoral shaft. No evidence of fracture, dislocation, or joint effusion. Patellar tendon enthesopathy versus chronic fracture deformity of the inferior patella is unchanged. Soft tissues are unremarkable. IMPRESSION: No acute osseous injury. 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 Electronically Signed   By: Romona Curls M.D.   On: 12/13/2023 08:15    Procedures Procedures    Medications Ordered in ED Medications  HYDROcodone-acetaminophen (NORCO/VICODIN) 5-325 MG per tablet 1 tablet (1 tablet Oral Given 12/13/23 0856)  dexamethasone (DECADRON) injection 10 mg (10 mg Intramuscular Given 12/13/23 0856)    ED Course/ Medical Decision Making/ A&P                                 Medical Decision Making Amount and/or Complexity of Data Reviewed Radiology: ordered.  Risk Prescription drug management.   Alexander Mccarthy 42 y.o. presented today for right ear pain, left knee pain. Working DDx that I considered at this time includes, but not limited to, contusion, strain/sprain, fracture, dislocation, neurovascular compromise, septic joint, ischemic limb, compartment syndrome,  otitis externa, necrotizing otitis externa, mastoiditis, meningitis, parotiditis, AOM, sialoadenitis/sialolithiasis.  R/o DDx: contusion, strain/sprain, dislocation, neurovascular compromise, septic joint, ischemic limb, compartment syndrome, necrotizing otitis externa, mastoiditis, meningitis, parotiditis, sialoadenitis/sialolithiasis: These are considered less likely due to history of present illness, physical exam, labs/imaging findings.  Review of prior external notes: 11/24/23 ED  Unique Tests and My Interpretation:  Left knee x-ray: patellar tendon enthesopathy vs chronic fracture  Social Determinants of Health: none  Discussion with Independent Historian: None  Discussion of Management of Tests: None  Risk: Medium: prescription drug management  Risk Stratification Score:none  Plan: On exam patient was in no acute distress with stable vitals.  On exam patient does have edematous  right ear canal without concerning features and so do suspect he has otitis externa and will prescribe antibiotics along with a shot of Decadron with ENT follow-up as he is had Omnicef in the past recently and this has not been working.  Patient was not endorsing any red flag features that would necessitate blood work or further imaging at this time.  Patient is left knee was neurovascularly intact and was not showing any concerning features but was mildly edematous.  Patient was endorsing any fevers or infectious symptoms that would be indicative of septic arthritis along with not having erythema or warmth on exam.  Also palpate patellar and quadricep tendons and so do not suspect tendon pathology.  Will get x-ray to rule out fractures and to evaluate for possible arthritis but do suspect Ortho follow-up with knee brace along with anti-inflammatories and ice.  Patient was also requesting CBG to be done as he is unsure whether or not he has diabetes and so we will order this as well.  Anticipate giving 1 dose of  Augmentin here along with prescription for suspected AOM as I am unable to visualize the tympanic membrane.  X-ray shows patient most likely has patellar tendon pathology from surgery versus chronic fracture from his surgery and so we will have him follow-up with orthopedics.  Encouraged OTCs every 6 hours along with ice and rest.  Patient was given 1 shot of Decadron here and given 1 dose of Augmentin prescribed the rest along with ENT follow-up.  At time of discharge patient was not endorsing any red flag symptoms to necessitate further workup at this time.  Patient states he is only wants to have his sugars checked and just wants the steroid injection and to be discharged.  Patient given knee brace before discharge.  Patient was given return precautions. Patient stable for discharge at this time.  Patient verbalized understanding of plan.  This chart was dictated using voice recognition software.  Despite best efforts to proofread,  errors can occur which can change the documentation meaning.         Final Clinical Impression(s) / ED Diagnoses Final diagnoses:  Otitis externa of right ear, unspecified chronicity, unspecified type  Acute pain of left knee    Rx / DC Orders ED Discharge Orders          Ordered    amoxicillin-clavulanate (AUGMENTIN) 875-125 MG tablet  2 times daily        12/13/23 0817              Netta Corrigan, PA-C 12/13/23 0901    Bethann Berkshire, MD 12/15/23 1030

## 2023-12-13 NOTE — Discharge Instructions (Signed)
Please follow-up with the ENT specialist and orthopedist I have attached your for you today.  Your exam shows a still having artery ear infection so I have given you another round of antibiotics and a steroid shot to help with the swelling.  You may take Tylenol ibuprofen every 6 hours needed for pain.  In terms of your knee there does appear to be either a chronic fracture versus tendon pathology that is present causing your pain in which you need to follow-up with an orthopedist about for further management.  If symptoms change or worsen please return to the ER.

## 2023-12-15 ENCOUNTER — Telehealth: Payer: Self-pay

## 2023-12-15 NOTE — Transitions of Care (Post Inpatient/ED Visit) (Signed)
   12/15/2023  Name: Alexander Mccarthy MRN: [id number] DOB: 02/06/1981  Today's TOC FU Call Status: Today's TOC FU Call Status:: Successful TOC FU Call Completed TOC FU Call Complete Date: 12/15/23 Patient's Name and Date of Birth confirmed.  Transition Care Management Follow-up Telephone Call Date of Discharge: 12/13/23 Discharge Facility: [facility]) Type of Discharge: Emergency Department Reason for ED Visit: Other: (Knee pain/Sw[facility] [facility]) How have you been since you were released from the hospital?: Same Any questions or concerns?: No  Items Reviewed: Did you receive and understand the discharge instructions provided?: Yes Medications obtained,verified, and reconciled?: Yes (Medications Reviewed) Any new allergies since your discharge?: No Dietary orders reviewed?: No Do you have support at home?: No  Medications Reviewed Today: Medications Reviewed Today     Reviewed by Dicky Doe, CMA (Certified Medical Assistant) on 12/15/23 at 1700  Med List Status: <None>   Medication Order Taking? Sig Documenting Provider Last Dose Status Informant  amoxicillin-clavulanate (AUGMENTIN) 875-125 MG tablet [id number] Yes Take 1 tablet by mouth 2 (two) times daily for 10 days. Netta Corrigan, PA-C Taking Active   ketorolac (TORADOL) 10 MG tablet [id number] Yes Take 1 tablet (10 mg total) by mouth every 6 (six) hours as needed (pain). Zenia Resides, MD Taking Active   tiZANidine (ZANAFLEX) 4 MG tablet [id number] Yes Take 1 tablet (4 mg total) by mouth every 8 (eight) hours as needed for muscle spasms. Zenia Resides, MD Taking Active   Med List Note Margo Aye, Fawn Kirk, CPhT 01/26/22 2215): -            Home Care and Equipment/Supplies: Were Home Health Services Ordered?: No Any new equipment or medical supplies ordered?: No  Functional Questionnaire: Do you need assistance with bathing/showering or dressing?: No Do you need assistance with meal preparation?: No Do  you need assistance with eating?: No Do you have difficulty maintaining continence: No Do you need assistance with getting out of bed/getting out of a chair/moving?: No Do you have difficulty managing or taking your medications?: No  Follow up appointments reviewed: PCP Follow-up appointment confirmed?: Yes Date of PCP follow-up appointment?: 12/19/23 Follow-up Provider: Richarda Blade, NP Specialist Hospital Follow-up appointment confirmed?: No Do you need transportation to your follow-up appointment?: No Do you understand care options if your condition(s) worsen?: Yes-patient verbalized understanding    SIGNATURE: Badr Piedra.D/RMA

## 2023-12-19 ENCOUNTER — Encounter: Payer: Self-pay | Admitting: Family

## 2023-12-22 ENCOUNTER — Encounter: Payer: Self-pay | Admitting: Family

## 2023-12-29 ENCOUNTER — Encounter: Payer: Self-pay | Admitting: Family
